# Patient Record
Sex: Female | Born: 1955 | Race: White | Hispanic: No
Health system: Southern US, Community
[De-identification: ages and names within clinical notes are randomized; demographics above are authoritative.]

## PROBLEM LIST (undated history)

## (undated) DIAGNOSIS — F419 Anxiety disorder, unspecified: Secondary | ICD-10-CM

## (undated) DIAGNOSIS — F32A Depression, unspecified: Secondary | ICD-10-CM

## (undated) DIAGNOSIS — I10 Essential (primary) hypertension: Secondary | ICD-10-CM

## (undated) DIAGNOSIS — T7840XA Allergy, unspecified, initial encounter: Secondary | ICD-10-CM

## (undated) DIAGNOSIS — M1990 Unspecified osteoarthritis, unspecified site: Secondary | ICD-10-CM

## (undated) DIAGNOSIS — R7303 Prediabetes: Secondary | ICD-10-CM

## (undated) DIAGNOSIS — N63 Unspecified lump in unspecified breast: Secondary | ICD-10-CM

## (undated) HISTORY — PX: COLONOSCOPY: SHX174

## (undated) HISTORY — DX: Allergy, unspecified, initial encounter: T78.40XA

## (undated) HISTORY — DX: Essential (primary) hypertension: I10

## (undated) HISTORY — PX: OTHER SURGICAL HISTORY: SHX169

## (undated) HISTORY — DX: Unspecified osteoarthritis, unspecified site: M19.90

---

## 2006-05-01 ENCOUNTER — Emergency Department: Payer: Self-pay | Admitting: Emergency Medicine

## 2008-01-24 DIAGNOSIS — G4700 Insomnia, unspecified: Secondary | ICD-10-CM

## 2008-01-24 LAB — HM DEXA SCAN: HM DEXA SCAN: NORMAL

## 2008-02-08 ENCOUNTER — Ambulatory Visit: Payer: Self-pay | Admitting: Family Medicine

## 2008-07-26 ENCOUNTER — Ambulatory Visit: Payer: Self-pay | Admitting: Family Medicine

## 2008-07-26 DIAGNOSIS — M542 Cervicalgia: Secondary | ICD-10-CM

## 2009-03-25 LAB — HM COLONOSCOPY: HM COLON: NORMAL

## 2009-04-01 ENCOUNTER — Ambulatory Visit: Payer: Self-pay | Admitting: Gastroenterology

## 2009-04-03 ENCOUNTER — Ambulatory Visit: Payer: Self-pay | Admitting: Family Medicine

## 2011-08-11 ENCOUNTER — Ambulatory Visit: Payer: Self-pay | Admitting: Family Medicine

## 2012-08-25 LAB — LIPID PANEL
Cholesterol: 234 mg/dL — AB (ref 0–200)
HDL: 87 mg/dL — AB (ref 35–70)
LDL Cholesterol: 130 mg/dL
TRIGLYCERIDES: 86 mg/dL (ref 40–160)

## 2012-08-25 LAB — HEMOGLOBIN A1C: Hgb A1c MFr Bld: 5.6 % (ref 4.0–6.0)

## 2012-08-30 ENCOUNTER — Ambulatory Visit: Payer: Self-pay | Admitting: Family Medicine

## 2012-12-13 LAB — HM PAP SMEAR: HM Pap smear: NORMAL

## 2014-01-15 ENCOUNTER — Ambulatory Visit: Payer: Self-pay | Admitting: Family Medicine

## 2014-01-15 LAB — HM MAMMOGRAPHY: HM Mammogram: NORMAL

## 2015-02-07 ENCOUNTER — Other Ambulatory Visit: Payer: Self-pay | Admitting: Family Medicine

## 2015-02-07 DIAGNOSIS — Z139 Encounter for screening, unspecified: Secondary | ICD-10-CM

## 2015-02-25 ENCOUNTER — Other Ambulatory Visit: Payer: Self-pay | Admitting: Family Medicine

## 2015-02-25 DIAGNOSIS — E2839 Other primary ovarian failure: Secondary | ICD-10-CM

## 2015-02-26 ENCOUNTER — Ambulatory Visit
Admission: RE | Admit: 2015-02-26 | Discharge: 2015-02-26 | Disposition: A | Discharge status: Home / Self Care | Payer: BLUE CROSS/BLUE SHIELD | Source: Ambulatory Visit | Attending: Family Medicine | Admitting: Family Medicine

## 2015-02-26 DIAGNOSIS — E2839 Other primary ovarian failure: Secondary | ICD-10-CM | POA: Diagnosis not present

## 2015-02-26 DIAGNOSIS — Z139 Encounter for screening, unspecified: Secondary | ICD-10-CM

## 2015-03-26 ENCOUNTER — Telehealth: Payer: Self-pay

## 2015-03-26 NOTE — Telephone Encounter (Signed)
-----   Message from Alba CoryKrichna Sowles, MD sent at 03/26/2015  8:53 AM EDT ----- Bone density was normal, continue calcium plus vitamin D supplementation  Please notify patient

## 2015-04-03 ENCOUNTER — Other Ambulatory Visit: Payer: Self-pay | Admitting: Family Medicine

## 2015-04-11 ENCOUNTER — Ambulatory Visit: Payer: Self-pay | Admitting: Family Medicine

## 2015-04-25 ENCOUNTER — Other Ambulatory Visit: Payer: Self-pay | Admitting: Family Medicine

## 2015-05-03 ENCOUNTER — Encounter: Payer: Self-pay | Admitting: Family Medicine

## 2015-05-03 DIAGNOSIS — M1990 Unspecified osteoarthritis, unspecified site: Secondary | ICD-10-CM | POA: Insufficient documentation

## 2015-05-03 DIAGNOSIS — I7300 Raynaud's syndrome without gangrene: Secondary | ICD-10-CM | POA: Insufficient documentation

## 2015-05-03 DIAGNOSIS — R7303 Prediabetes: Secondary | ICD-10-CM | POA: Insufficient documentation

## 2015-05-03 DIAGNOSIS — B009 Herpesviral infection, unspecified: Secondary | ICD-10-CM | POA: Insufficient documentation

## 2015-05-03 DIAGNOSIS — E559 Vitamin D deficiency, unspecified: Secondary | ICD-10-CM | POA: Insufficient documentation

## 2015-05-03 DIAGNOSIS — N951 Menopausal and female climacteric states: Secondary | ICD-10-CM | POA: Insufficient documentation

## 2015-05-03 DIAGNOSIS — F320 Major depressive disorder, single episode, mild: Secondary | ICD-10-CM | POA: Insufficient documentation

## 2015-05-06 ENCOUNTER — Encounter: Payer: Self-pay | Admitting: Family Medicine

## 2015-05-06 ENCOUNTER — Ambulatory Visit (INDEPENDENT_AMBULATORY_CARE_PROVIDER_SITE_OTHER): Payer: BLUE CROSS/BLUE SHIELD | Admitting: Family Medicine

## 2015-05-06 VITALS — BP 108/62 | HR 81 | Temp 98.3°F | Resp 16 | Ht 66.0 in | Wt 161.2 lb

## 2015-05-06 DIAGNOSIS — Z79899 Other long term (current) drug therapy: Secondary | ICD-10-CM | POA: Diagnosis not present

## 2015-05-06 DIAGNOSIS — E559 Vitamin D deficiency, unspecified: Secondary | ICD-10-CM | POA: Diagnosis not present

## 2015-05-06 DIAGNOSIS — F320 Major depressive disorder, single episode, mild: Secondary | ICD-10-CM

## 2015-05-06 DIAGNOSIS — R7309 Other abnormal glucose: Secondary | ICD-10-CM

## 2015-05-06 DIAGNOSIS — Z13220 Encounter for screening for lipoid disorders: Secondary | ICD-10-CM | POA: Diagnosis not present

## 2015-05-06 DIAGNOSIS — R5383 Other fatigue: Secondary | ICD-10-CM | POA: Diagnosis not present

## 2015-05-06 DIAGNOSIS — M542 Cervicalgia: Secondary | ICD-10-CM | POA: Diagnosis not present

## 2015-05-06 DIAGNOSIS — G4700 Insomnia, unspecified: Secondary | ICD-10-CM

## 2015-05-06 DIAGNOSIS — I7300 Raynaud's syndrome without gangrene: Secondary | ICD-10-CM

## 2015-05-06 DIAGNOSIS — R7303 Prediabetes: Secondary | ICD-10-CM

## 2015-05-06 MED ORDER — MELOXICAM 15 MG PO TABS: 15.0000 mg | ORAL_TABLET | Freq: Every day | ORAL | Status: DC

## 2015-05-06 MED ORDER — TRAZODONE HCL 50 MG PO TABS: 50.0000 mg | ORAL_TABLET | Freq: Every day | ORAL | Status: DC

## 2015-05-06 MED ORDER — ALPRAZOLAM 0.5 MG PO TABS: 0.5000 mg | ORAL_TABLET | Freq: Two times a day (BID) | ORAL | Status: DC | PRN

## 2015-05-06 MED ORDER — LISINOPRIL 10 MG PO TABS: 10.0000 mg | ORAL_TABLET | Freq: Every day | ORAL | Status: DC

## 2015-05-06 NOTE — Progress Notes (Signed)
Name: Denise Rasmussen   MRN: [id number]    DOB: 03/27/1956   Date:05/06/2015       Progress Note  Subjective  Chief Complaint  Chief Complaint  Patient presents with  . Depression    Patient states only taking 1/2 a wellbutrin in am and pm instead of 1, she states it makes her mean  . Insomnia    avg sleep per night 7-8hrs  . Neck Pain    HPI  Depression Major Depressive: still under a lot of stress. Her son got married in 2023/01/24, and her mother-in-law died around the same time. Her husband has metastases in his brain. She is taking medications, but advised to discuss with psychiatrist about taking half pill of wellbutrin SR , explained that SR are usuallyn not allowed to cut in half.   Insomnia: She is taking medications, and helps her fall and stay asleep, denies side effects of medication  Neck Pain: she takes medication prn only, but she had daily aching sensation, states sometimes she has tingling on arms, but no weakness.   Fatigue: she is always very tired, worried about being sick and unable to help her husband  Raynaud's : doing well with heat and ace inhibitor  Patient Active Problem List   Diagnosis Date Noted  . Herpes simplex type 1 infection 05/03/2015  . Depression, major, single episode, mild 05/03/2015  . Arthritis, degenerative 05/03/2015  . Post menopausal syndrome 05/03/2015  . Borderline diabetes 05/03/2015  . Raynaud's syndrome 05/03/2015  . Vitamin D deficiency 05/03/2015  . Cervical pain 07/26/2008  . Insomnia 01/24/2008    History reviewed. No pertinent past surgical history.  Family History  Problem Relation Age of Onset  . Breast cancer Maternal Grandmother     History   Social History  . Marital Status: Married    Spouse Name: N/A  . Number of Children: N/A  . Years of Education: N/A   Occupational History  . Not on file.   Social History Main Topics  . Smoking status: Never Smoker   . Smokeless tobacco: Not on file  . Alcohol Use:  0.0 oz/week    0 Standard drinks or equivalent per week     Comment: occasionally  . Drug Use: No  . Sexual Activity: Yes   Other Topics Concern  . Not on file   Social History Narrative     Current outpatient prescriptions:  .  ALPRAZolam (XANAX) 0.5 MG tablet, TAKE 1 TABLET BY MOUTH EVERY DAY AS NEEDED FOR ANXIETY, Disp: 30 tablet, Rfl: 0 .  baclofen (LIORESAL) 20 MG tablet, Take 1 tablet by mouth as needed., Disp: , Rfl: 0 .  buPROPion (WELLBUTRIN SR) 100 MG 12 hr tablet, Take 1 tablet by mouth 2 (two) times daily., Disp: , Rfl: 2 .  Cholecalciferol (VITAMIN D) 2000 UNITS CAPS, Take by mouth., Disp: , Rfl:  .  DULoxetine (CYMBALTA) 30 MG capsule, Take 1 capsule by mouth daily., Disp: , Rfl: 0 .  Glucosamine-Chondroitin (GLUCOSAMINE CHONDROITIN COMPLX) 500-250 MG CAPS, Take by mouth., Disp: , Rfl:  .  lisinopril (PRINIVIL,ZESTRIL) 10 MG tablet, TAKE 1 TABLET BY MOUTH DAILY FOR RAYNAUD'S AND ALSO BLOOD PRESSURE, Disp: 30 tablet, Rfl: 0 .  meloxicam (MOBIC) 15 MG tablet, TAKE 1 TABLET BY MOUTH EVERY DAY FOR INFLAMMATION, Disp: 30 tablet, Rfl: 0 .  OMEGA-3 FATTY ACIDS-VITAMIN E PO, Take by mouth., Disp: , Rfl:  .  traZODone (DESYREL) 50 MG tablet, Take 1 tablet by mouth daily.,  Disp: , Rfl: 0 .  valACYclovir (VALTREX) 500 MG tablet, Take 1 tablet by mouth as needed., Disp: , Rfl:   Not on File   ROS  Constitutional: Negative for fever or weight change.  Respiratory: Negative for cough and shortness of breath.   Cardiovascular: Negative for chest pain or palpitations.  Gastrointestinal: Negative for abdominal pain, no bowel changes.  Musculoskeletal: Negative for gait problem or joint swelling.  Skin: Negative for rash.  Neurological: Negative for dizziness or headache.  No other specific complaints in a complete review of systems (except as listed in HPI above). Objective  Filed Vitals:   05/06/15 1623  BP: 108/62  Pulse: 81  Temp: 98.3 F (36.8 C)  TempSrc: Oral  Resp:  16  Height: _0  (1.676 m)  Weight: 161 lb 3.2 oz (73.12 kg)  SpO2: 97%    Body mass index is 26.03 kg/(m^2).  Physical Exam  Constitutional: Patient appears well-developed and well-nourished. No distress.  Eyes:  No scleral icterus. PERL Neck: Normal range of motion. Neck supple. No tenderness to palpation  Cardiovascular: Normal rate, regular rhythm and normal heart sounds.  No murmur heard. No BLE edema. Pulmonary/Chest: Effort normal and breath sounds normal. No respiratory distress. Abdominal: Soft.  There is no tenderness. Psychiatric: Patient has a normal mood and affect. behavior is normal. Judgment and thought content normal. Frustrated about her current situation - caregiver for her husband   PHQ2/9: Depression screen PHQ 2/9 05/06/2015  Decreased Interest 2  Down, Depressed, Hopeless 2  PHQ - 2 Score 4  Altered sleeping 0  Tired, decreased energy 1  Change in appetite 0  Feeling bad or failure about yourself  0  Trouble concentrating 1  Moving slowly or fidgety/restless 0  Suicidal thoughts 0  PHQ-9 Score 6  Difficult doing work/chores Somewhat difficult   Seeing Psychiatrist  Fall Risk: Fall Risk  05/06/2015  Falls in the past year? No    Assessment & Plan   1. Insomnia  Stable on medication  - traZODone (DESYREL) 50 MG tablet; Take 1 tablet (50 mg total) by mouth daily.  Dispense: 90 tablet; Refill: 1  2. Cervical pain  She is very concerned, advised PT or massage therapy, she wants to continue medication prn and check X-ray, consider referral to Dr. Phyllis Ginger - [facility] Cervical Spine Complete; Future - Sed Rate (ESR) - meloxicam (MOBIC) 15 MG tablet; Take 1 tablet (15 mg total) by mouth daily.  Dispense: 90 tablet; Refill: 1  3. Borderline diabetes  Check hgbA1C   4. Raynaud's syndrome  - lisinopril (PRINIVIL,ZESTRIL) 10 MG tablet; Take 1 tablet (10 mg total) by mouth daily.  Dispense: 90 tablet; Refill: 1  5. Vitamin D deficiency  Check  level  6. Long-term use of high-risk medication  - CBC with Differential - Comprehensive Metabolic Panel (CMET)  7. Other fatigue  - CBC with Differential - Comprehensive Metabolic Panel (CMET) - TSH - Vitamin D (25 hydroxy) - Vitamin B12 - Sed Rate (ESR)  8. Lipid screening  - Lipid Profile  9. Depression, major, single episode, mild  - ALPRAZolam (XANAX) 0.5 MG tablet; Take 1 tablet (0.5 mg total) by mouth 2 (two) times daily as needed for anxiety.  Dispense: 30 tablet; Refill: 0

## 2015-05-09 ENCOUNTER — Other Ambulatory Visit: Payer: Self-pay | Admitting: Family Medicine

## 2015-05-10 LAB — CBC WITH DIFFERENTIAL/PLATELET
BASOS ABS: 0 10*3/uL (ref 0.0–0.2)
Basos: 1 %
EOS (ABSOLUTE): 0.1 10*3/uL (ref 0.0–0.4)
Eos: 3 %
Hematocrit: 40.3 % (ref 34.0–46.6)
Hemoglobin: 13.6 g/dL (ref 11.1–15.9)
IMMATURE GRANS (ABS): 0 10*3/uL (ref 0.0–0.1)
Immature Granulocytes: 0 %
LYMPHS: 32 %
Lymphocytes Absolute: 1.3 10*3/uL (ref 0.7–3.1)
MCH: 30.6 pg (ref 26.6–33.0)
MCHC: 33.7 g/dL (ref 31.5–35.7)
MCV: 91 fL (ref 79–97)
MONOS ABS: 0.4 10*3/uL (ref 0.1–0.9)
Monocytes: 11 %
NEUTROS PCT: 53 %
Neutrophils Absolute: 2.3 10*3/uL (ref 1.4–7.0)
Platelets: 227 10*3/uL (ref 150–379)
RBC: 4.44 x10E6/uL (ref 3.77–5.28)
RDW: 13.4 % (ref 12.3–15.4)
WBC: 4.2 10*3/uL (ref 3.4–10.8)

## 2015-05-10 LAB — COMPREHENSIVE METABOLIC PANEL
A/G RATIO: 2 (ref 1.1–2.5)
ALBUMIN: 4.7 g/dL (ref 3.5–5.5)
ALK PHOS: 89 IU/L (ref 39–117)
ALT: 11 IU/L (ref 0–32)
AST: 12 IU/L (ref 0–40)
BILIRUBIN TOTAL: 0.3 mg/dL (ref 0.0–1.2)
BUN / CREAT RATIO: 27 — AB (ref 9–23)
BUN: 25 mg/dL — ABNORMAL HIGH (ref 6–24)
CO2: 27 mmol/L (ref 18–29)
Calcium: 9.6 mg/dL (ref 8.7–10.2)
Chloride: 100 mmol/L (ref 97–108)
Creatinine, Ser: 0.91 mg/dL (ref 0.57–1.00)
GFR calc Af Amer: 82 mL/min/{1.73_m2} (ref >59)
GFR calc non Af Amer: 71 mL/min/{1.73_m2} (ref >59)
Globulin, Total: 2.3 g/dL (ref 1.5–4.5)
Glucose: 95 mg/dL (ref 65–99)
POTASSIUM: 4.4 mmol/L (ref 3.5–5.2)
SODIUM: 142 mmol/L (ref 134–144)
Total Protein: 7 g/dL (ref 6.0–8.5)

## 2015-05-10 LAB — LIPID PANEL
Chol/HDL Ratio: 2.7 ratio units (ref 0.0–4.4)
Cholesterol, Total: 225 mg/dL — ABNORMAL HIGH (ref 100–199)
HDL: 83 mg/dL (ref >39)
LDL Calculated: 127 mg/dL — ABNORMAL HIGH (ref 0–99)
Triglycerides: 74 mg/dL (ref 0–149)
VLDL Cholesterol Cal: 15 mg/dL (ref 5–40)

## 2015-05-10 LAB — TSH: TSH: 2.1 u[IU]/mL (ref 0.450–4.500)

## 2015-05-10 LAB — HEMOGLOBIN A1C
ESTIMATED AVERAGE GLUCOSE: 120 mg/dL
HEMOGLOBIN A1C: 5.8 % — AB (ref 4.8–5.6)

## 2015-05-10 LAB — VITAMIN B12: Vitamin B-12: 414 pg/mL (ref 211–946)

## 2015-05-10 LAB — SEDIMENTATION RATE: SED RATE: 2 mm/h (ref 0–40)

## 2015-05-10 LAB — VITAMIN D 25 HYDROXY (VIT D DEFICIENCY, FRACTURES): Vit D, 25-Hydroxy: 41.7 ng/mL (ref 30.0–100.0)

## 2015-05-12 ENCOUNTER — Telehealth: Payer: Self-pay

## 2015-05-12 NOTE — Telephone Encounter (Signed)
-----   Message from Alba CoryKrichna Sowles, MD sent at 05/11/2015  2:14 PM EDT ----- Labs are looking good- all within normal limits Based on the results of lipid panel her cardiovascular risk factor in the next 10 years is :1.6% and she does not need to start statin therapy

## 2015-05-12 NOTE — Telephone Encounter (Signed)
Lft VM all labs normal

## 2015-07-29 ENCOUNTER — Other Ambulatory Visit: Payer: Self-pay | Admitting: Family Medicine

## 2016-01-02 ENCOUNTER — Other Ambulatory Visit: Payer: Self-pay | Admitting: Family Medicine

## 2016-01-02 NOTE — Telephone Encounter (Signed)
Patient requesting refill. 

## 2016-01-13 ENCOUNTER — Encounter: Payer: Self-pay | Admitting: Family Medicine

## 2016-01-13 ENCOUNTER — Ambulatory Visit (INDEPENDENT_AMBULATORY_CARE_PROVIDER_SITE_OTHER): Payer: BLUE CROSS/BLUE SHIELD | Admitting: Family Medicine

## 2016-01-13 VITALS — BP 108/60 | HR 90 | Temp 98.6°F | Resp 14 | Ht 66.0 in | Wt 166.2 lb

## 2016-01-13 DIAGNOSIS — I7300 Raynaud's syndrome without gangrene: Secondary | ICD-10-CM

## 2016-01-13 DIAGNOSIS — F320 Major depressive disorder, single episode, mild: Secondary | ICD-10-CM

## 2016-01-13 DIAGNOSIS — L299 Pruritus, unspecified: Secondary | ICD-10-CM | POA: Diagnosis not present

## 2016-01-13 DIAGNOSIS — L989 Disorder of the skin and subcutaneous tissue, unspecified: Secondary | ICD-10-CM | POA: Diagnosis not present

## 2016-01-13 DIAGNOSIS — G4700 Insomnia, unspecified: Secondary | ICD-10-CM | POA: Diagnosis not present

## 2016-01-13 DIAGNOSIS — R7303 Prediabetes: Secondary | ICD-10-CM | POA: Diagnosis not present

## 2016-01-13 DIAGNOSIS — B009 Herpesviral infection, unspecified: Secondary | ICD-10-CM | POA: Diagnosis not present

## 2016-01-13 DIAGNOSIS — M542 Cervicalgia: Secondary | ICD-10-CM | POA: Diagnosis not present

## 2016-01-13 DIAGNOSIS — F4321 Adjustment disorder with depressed mood: Secondary | ICD-10-CM

## 2016-01-13 DIAGNOSIS — E559 Vitamin D deficiency, unspecified: Secondary | ICD-10-CM | POA: Diagnosis not present

## 2016-01-13 MED ORDER — ALPRAZOLAM 0.5 MG PO TABS: 0.5000 mg | ORAL_TABLET | Freq: Two times a day (BID) | ORAL | Status: DC | PRN

## 2016-01-13 MED ORDER — LISINOPRIL 10 MG PO TABS: 10.0000 mg | ORAL_TABLET | Freq: Every day | ORAL | Status: DC

## 2016-01-13 NOTE — Progress Notes (Signed)
Name: Denise Rasmussen   MRN: [id number]    DOB: 09-06-1956   Date:01/13/2016       Progress Note  Subjective  Chief Complaint  Chief Complaint  Patient presents with  . Depression  . Insomnia  . Medication Refill    Xanax, Lisinopril & meloxicam  . Hand Pain    patient has a wart on her left finger that she wants to have it removed.  . Rash    patient has an itchy area on her back that has been there for some time now. patient has tried otc lotions and ointments    HPI  Lesion on left index finger: going on for the past 6 months, using topical wart medication but it keeps coming back and is painful to touch, no oozing or redness  Pruritus: on her back, on right mid back, no skin lesions.  HSVII: recurrent fever blister, she used to have recurrent rash on buttocks , married for 35 years, no medication for prevention is being used at this time.   Raynauds: doing well on lisinopril, she avoid sandals, wears soaks, using a heater at work, and is controlled at this time  Major Depression: still under a lot of stress, mother died in 08/05/23 also having problems with the way the will was made. She also struggles with husbands medical problems, diagnosed with cancer in 2003 with prostate cancer, had prostatectomy and in 2006 had a lump on his lung, followed by thyroid metastases - had thyroidectomy, after that had it spread to his pleura, last year treated for brain metastasis now he has a lesion on his kidney. She is taking medications, sees Dr. Maryruth Bun and also a counselor but not feeling much better.   Insomnia: stable on medication, no side effects   Patient Active Problem List   Diagnosis Date Noted  . Herpes simplex type 2 infection 05/03/2015  . Depression, major, single episode, mild (HCC) 05/03/2015  . Arthritis, degenerative 05/03/2015  . Post menopausal syndrome 05/03/2015  . Borderline diabetes 05/03/2015  . Raynaud's syndrome 05/03/2015  . Vitamin D deficiency 05/03/2015  .  Cervical pain 07/26/2008  . Insomnia 01/24/2008    History reviewed. No pertinent past surgical history.  Family History  Problem Relation Age of Onset  . Breast cancer Maternal Grandmother     Social History   Social History  . Marital Status: Married    Spouse Name: N/A  . Number of Children: N/A  . Years of Education: N/A   Occupational History  . Not on file.   Social History Main Topics  . Smoking status: Never Smoker   . Smokeless tobacco: Not on file  . Alcohol Use: 0.0 oz/week    0 Standard drinks or equivalent per week     Comment: occasionally  . Drug Use: No  . Sexual Activity: Yes   Other Topics Concern  . Not on file   Social History Narrative     Current outpatient prescriptions:  .  ALPRAZolam (XANAX) 0.5 MG tablet, Take 1 tablet (0.5 mg total) by mouth 2 (two) times daily as needed for anxiety., Disp: 30 tablet, Rfl: 0 .  buPROPion (WELLBUTRIN SR) 100 MG 12 hr tablet, Take 1 tablet by mouth 2 (two) times daily., Disp: , Rfl: 2 .  Cholecalciferol (VITAMIN D) 2000 UNITS CAPS, Take by mouth., Disp: , Rfl:  .  DULoxetine (CYMBALTA) 30 MG capsule, Take 3 capsules by mouth daily., Disp: , Rfl: 0 .  Glucosamine-Chondroitin (GLUCOSAMINE CHONDROITIN  COMPLX) 500-250 MG CAPS, Take by mouth., Disp: , Rfl:  .  lisinopril (PRINIVIL,ZESTRIL) 10 MG tablet, Take 1 tablet (10 mg total) by mouth daily., Disp: 90 tablet, Rfl: 1 .  meloxicam (MOBIC) 15 MG tablet, TAKE 1 TABLET(15 MG) BY MOUTH DAILY, Disp: 90 tablet, Rfl: 0 .  OMEGA-3 FATTY ACIDS-VITAMIN E PO, Take 1,000 mg by mouth. , Disp: , Rfl:  .  traZODone (DESYREL) 50 MG tablet, Take 1 tablet (50 mg total) by mouth daily., Disp: 90 tablet, Rfl: 1 .  baclofen (LIORESAL) 20 MG tablet, Take 1 tablet by mouth as needed. Reported on 01/13/2016, Disp: , Rfl: 0 .  valACYclovir (VALTREX) 500 MG tablet, TAKE 1 TABLET BY MOUTH THREE TIMES DAILY FOR 7 DAYS AS NEEDED FOR OUT BREAKS OR 2 TABLETS TWICE DAILY FOR FEVER BLISTERS  (Patient not taking: Reported on 01/13/2016), Disp: 30 tablet, Rfl: 2  No Known Allergies   ROS  Ten systems reviewed and is negative except as mentioned in HPI   Objective  Filed Vitals:   01/13/16 1519  BP: 108/60  Pulse: 90  Temp: 98.6 F (37 C)  TempSrc: Oral  Resp: 14  Height:  (1.676 m)  Weight: 166 lb 3.2 oz (75.388 kg)  SpO2: 96%    Body mass index is 26.84 kg/(m^2).  Physical Exam  Constitutional: Patient appears well-developed and well-nourished No distress.  HEENT: head atraumatic, normocephalic, pupils equal and reactive to light,  neck supple, throat within normal limits Cardiovascular: Normal rate, regular rhythm and normal heart sounds.  No murmur heard. No BLE edema. Pulmonary/Chest: Effort normal and breath sounds normal. No respiratory distress. Abdominal: Soft.  There is no tenderness. Psychiatric: Patient has a normal mood and affect. behavior is normal. Judgment and thought content normal. Skin; hyperpigmentation on her back, no rashes, also has a hard spot on left index finger that is tender and also on left middle finger causing deformity of the nail    PHQ2/9: Depression screen [facility] 2/9 01/13/2016 05/06/2015  Decreased Interest - 2  Down, Depressed, Hopeless 3 2  PHQ - 2 Score 3 4  Altered sleeping 2 0  Tired, decreased energy 2 1  Change in appetite 0 0  Feeling bad or failure about yourself  0 0  Trouble concentrating 1 1  Moving slowly or fidgety/restless 0 0  Suicidal thoughts 0 0  PHQ-9 Score 8 6  Difficult doing work/chores Somewhat difficult Somewhat difficult     Fall Risk: Fall Risk  01/13/2016 05/06/2015  Falls in the past year? No No     Functional Status Survey: Is the patient deaf or have difficulty hearing?: No Does the patient have difficulty seeing, even when wearing glasses/contacts?: No Does the patient have difficulty concentrating, remembering, or making decisions?: Yes Does the patient have difficulty walking  or climbing stairs?: No Does the patient have difficulty dressing or bathing?: No Does the patient have difficulty doing errands alone such as visiting a doctor's office or shopping?: No    Assessment & Plan  1. Insomnia  Continue medication   2. Borderline diabetes  Recheck labs yearly   3. Depression, major, single episode, mild (HCC)  Continue follow up with psychiatrist - ALPRAZolam (XANAX) 0.5 MG tablet; Take 1 tablet (0.5 mg total) by mouth 2 (two) times daily as needed for anxiety.  Dispense: 30 tablet; Refill: 0  4. Cervical pain  Stable, but advised to take half of Meloxicam or alternate with Tylenol   5. Vitamin D  deficiency  Continue vitamin D supplementation   6. Herpes simplex type 2 infection  Continue prn mediation, only has occasional fever blisters now, used to have recurrent rash on buttocks 7. Raynaud's disease without gangrene  - lisinopril (PRINIVIL,ZESTRIL) 10 MG tablet; Take 1 tablet (10 mg total) by mouth daily.  Dispense: 90 tablet; Refill: 1  8. Grieving  Mother died October 2016  9. Pruritus  - Ambulatory referral to Dermatology  10. Skin lesion of hand  - Ambulatory referral to Dermatology

## 2016-01-27 ENCOUNTER — Telehealth: Payer: Self-pay | Admitting: Family Medicine

## 2016-01-27 NOTE — Telephone Encounter (Signed)
On your desk to be faxed. Thank you

## 2016-01-27 NOTE — Telephone Encounter (Signed)
Orders was faxed and confirmation was received.

## 2016-01-27 NOTE — Telephone Encounter (Signed)
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 [facility] [phone]) [phone]) [phone] ATTN: Massie BougieBelinda Day

## 2016-01-28 ENCOUNTER — Other Ambulatory Visit: Payer: Self-pay | Admitting: Family Medicine

## 2016-01-28 DIAGNOSIS — Z1231 Encounter for screening mammogram for malignant neoplasm of breast: Secondary | ICD-10-CM

## 2016-01-29 ENCOUNTER — Ambulatory Visit
Admission: RE | Admit: 2016-01-29 | Discharge: 2016-01-29 | Disposition: A | Discharge status: Home / Self Care | Payer: BLUE CROSS/BLUE SHIELD | Source: Ambulatory Visit | Attending: Family Medicine | Admitting: Family Medicine

## 2016-01-29 DIAGNOSIS — Z1231 Encounter for screening mammogram for malignant neoplasm of breast: Secondary | ICD-10-CM | POA: Insufficient documentation

## 2016-02-03 ENCOUNTER — Other Ambulatory Visit: Payer: Self-pay | Admitting: Family Medicine

## 2016-02-03 NOTE — Telephone Encounter (Signed)
Patient requesting refill. 

## 2016-02-16 ENCOUNTER — Other Ambulatory Visit: Payer: Self-pay | Admitting: Family Medicine

## 2016-02-16 NOTE — Telephone Encounter (Signed)
Patient requesting refill. 

## 2016-03-31 ENCOUNTER — Other Ambulatory Visit: Payer: Self-pay | Admitting: Family Medicine

## 2016-04-01 NOTE — Telephone Encounter (Signed)
Patient requesting refill. 

## 2016-04-28 ENCOUNTER — Other Ambulatory Visit: Payer: Self-pay | Admitting: Family Medicine

## 2016-05-25 ENCOUNTER — Other Ambulatory Visit: Payer: Self-pay | Admitting: Family Medicine

## 2016-05-25 DIAGNOSIS — I7300 Raynaud's syndrome without gangrene: Secondary | ICD-10-CM

## 2016-05-26 NOTE — Telephone Encounter (Signed)
Patient requesting refill. Lisinopril

## 2016-06-30 DIAGNOSIS — M79643 Pain in unspecified hand: Secondary | ICD-10-CM | POA: Insufficient documentation

## 2016-07-01 ENCOUNTER — Encounter: Payer: Self-pay | Admitting: Family Medicine

## 2016-07-01 DIAGNOSIS — M6740 Ganglion, unspecified site: Secondary | ICD-10-CM | POA: Insufficient documentation

## 2016-07-13 ENCOUNTER — Ambulatory Visit (INDEPENDENT_AMBULATORY_CARE_PROVIDER_SITE_OTHER): Payer: BLUE CROSS/BLUE SHIELD | Admitting: Family Medicine

## 2016-07-13 ENCOUNTER — Encounter: Payer: Self-pay | Admitting: Family Medicine

## 2016-07-13 VITALS — BP 107/63 | HR 92 | Temp 98.3°F | Resp 16 | Ht 66.0 in | Wt 164.3 lb

## 2016-07-13 DIAGNOSIS — B009 Herpesviral infection, unspecified: Secondary | ICD-10-CM | POA: Diagnosis not present

## 2016-07-13 DIAGNOSIS — Z9889 Other specified postprocedural states: Secondary | ICD-10-CM

## 2016-07-13 DIAGNOSIS — R7303 Prediabetes: Secondary | ICD-10-CM | POA: Diagnosis not present

## 2016-07-13 DIAGNOSIS — Z79899 Other long term (current) drug therapy: Secondary | ICD-10-CM

## 2016-07-13 DIAGNOSIS — I7300 Raynaud's syndrome without gangrene: Secondary | ICD-10-CM

## 2016-07-13 DIAGNOSIS — Z13220 Encounter for screening for lipoid disorders: Secondary | ICD-10-CM

## 2016-07-13 DIAGNOSIS — E559 Vitamin D deficiency, unspecified: Secondary | ICD-10-CM | POA: Diagnosis not present

## 2016-07-13 DIAGNOSIS — F320 Major depressive disorder, single episode, mild: Secondary | ICD-10-CM | POA: Diagnosis not present

## 2016-07-13 DIAGNOSIS — M542 Cervicalgia: Secondary | ICD-10-CM

## 2016-07-13 MED ORDER — LISINOPRIL 10 MG PO TABS: ORAL_TABLET | ORAL | 1 refills | Status: DC

## 2016-07-13 MED ORDER — MELOXICAM 15 MG PO TABS: ORAL_TABLET | ORAL | 1 refills | Status: DC

## 2016-07-13 NOTE — Progress Notes (Signed)
Name: Denise Rasmussen   MRN: [id number]    DOB: 1956/01/30   Date:07/13/2016       Progress Note  Subjective  Chief Complaint  Chief Complaint  Patient presents with  . Follow-up    6 mo    HPI  History of hand surgery :  Patient went to hand surgeon because the lesion on right hand was causing pain when shaking hands, healing now, had 4 stiches.   HSVII: recurrent fever blister, she used to have recurrent rash on buttocks , married for 35 years, no medication for prevention is being used at this time. No recent outbreaks, last episode about 4 months ago  Raynauds: doing well on lisinopril, she avoid sandals, wears soaks, using a heater at work, and is controlled at this time  Major Depression: still under a lot of stress, mother died in 08/23/2015 also having problems with the way the will was made. She also struggles with husbands medical problems, diagnosed with cancer in 2003 with prostate cancer, had prostatectomy and in 2006 had a lump on his lung, followed by thyroid metastases - had thyroidectomy, after that had it spread to his pleura, last year treated for brain metastasis now he has a lesion on his kidney - he is feeling better now. She is taking medications, sees Dr. Maryruth Bun and also a counselor Audria Nine. She likes her counselor.   Insomnia: stable on medication, no side effects, she states able to skip doses  Pre-diabetes: due for labs. She denies polyphagia, polyuria or polydipsia   Patient Active Problem List   Diagnosis Date Noted  . Ganglion cyst 07/01/2016  . Herpes simplex type 2 infection 05/03/2015  . Depression, major, single episode, mild (HCC) 05/03/2015  . Arthritis, degenerative 05/03/2015  . Post menopausal syndrome 05/03/2015  . Borderline diabetes 05/03/2015  . Raynaud's syndrome 05/03/2015  . Vitamin D deficiency 05/03/2015  . Cervical pain 07/26/2008  . Insomnia 01/24/2008    History reviewed. No pertinent surgical history.  Family  History  Problem Relation Age of Onset  . Breast cancer Maternal Grandmother     Social History   Social History  . Marital status: Married    Spouse name: N/A  . Number of children: N/A  . Years of education: N/A   Occupational History  . Not on file.   Social History Main Topics  . Smoking status: Never Smoker  . Smokeless tobacco: Never Used  . Alcohol use 0.0 oz/week     Comment: occasionally  . Drug use: No  . Sexual activity: Yes   Other Topics Concern  . Not on file   Social History Narrative  . No narrative on file     Current Outpatient Prescriptions:  .  ALPRAZolam (XANAX) 0.5 MG tablet, TAKE 1 TABLET BY MOUTH EVERY DAY AS NEEDED FOR ANXIETY, Disp: 30 tablet, Rfl: 0 .  buPROPion (WELLBUTRIN SR) 150 MG 12 hr tablet, Take 1 tablet by mouth daily., Disp: , Rfl: 2 .  Cholecalciferol (VITAMIN D) 2000 UNITS CAPS, Take by mouth., Disp: , Rfl:  .  DULoxetine (CYMBALTA) 30 MG capsule, Take 3 capsules by mouth daily., Disp: , Rfl: 0 .  lisinopril (PRINIVIL,ZESTRIL) 10 MG tablet, TAKE 1 TABLET(10 MG) BY MOUTH DAILY, Disp: 90 tablet, Rfl: 0 .  meloxicam (MOBIC) 15 MG tablet, TAKE 1 TABLET BY MOUTH EVERY DAY FOR INFLAMMATION, Disp: 30 tablet, Rfl: 2 .  OMEGA-3 FATTY ACIDS-VITAMIN E PO, Take 1,000 mg by mouth. , Disp: ,  Rfl:  .  traZODone (DESYREL) 50 MG tablet, Take 1 tablet (50 mg total) by mouth daily. (Patient taking differently: Take 50-100 mg by mouth at bedtime. ), Disp: 90 tablet, Rfl: 1 .  valACYclovir (VALTREX) 500 MG tablet, TAKE 1 TABLET BY MOUTH THREE TIMES DAILY FOR 7 DAYS AS NEEDED FOR OUT BREAKS OR 2 TABLETS TWICE DAILY FOR FEVER BLISTERS, Disp: 30 tablet, Rfl: 2 .  baclofen (LIORESAL) 20 MG tablet, Take 1 tablet by mouth as needed. Reported on 01/13/2016, Disp: , Rfl: 0 .  Glucosamine-Chondroitin (GLUCOSAMINE CHONDROITIN COMPLX) 500-250 MG CAPS, Take by mouth., Disp: , Rfl:   No Known Allergies   ROS  Constitutional: Negative for fever or weight change.   Respiratory: Negative for cough and shortness of breath.   Cardiovascular: Negative for chest pain or palpitations.  Gastrointestinal: Negative for abdominal pain, no bowel changes.  Musculoskeletal: Negative for gait problem or joint swelling.  Skin: Negative for rash.  Neurological: Negative for dizziness or headache.  No other specific complaints in a complete review of systems (except as listed in HPI above).  Objective  Vitals:   07/13/16 0801  BP: 107/63  Pulse: 92  Resp: 16  Temp: 98.3 F (36.8 C)  TempSrc: Oral  SpO2: 96%  Weight: 164 lb 4.8 oz (74.5 kg)  Height: 5\' 6"  (1.676 m)    Body mass index is 26.52 kg/m.  Physical Exam  Constitutional: Patient appears well-developed and well-nourished.  No distress.  HEENT: head atraumatic, normocephalic, pupils equal and reactive to light, neck supple, throat within normal limits Cardiovascular: Normal rate, regular rhythm and normal heart sounds.  No murmur heard. No BLE edema. Pulmonary/Chest: Effort normal and breath sounds normal. No respiratory distress. Abdominal: Soft.  There is no tenderness. Psychiatric: Patient has a normal mood and affect. behavior is normal. Judgment and thought content normal.  PHQ2/9: Depression screen [facility] 2/9 07/13/2016 01/13/2016 05/06/2015  Decreased Interest 0 - 2  Down, Depressed, Hopeless 0 3 2  PHQ - 2 Score 0 3 4  Altered sleeping - 2 0  Tired, decreased energy - 2 1  Change in appetite - 0 0  Feeling bad or failure about yourself  - 0 0  Trouble concentrating - 1 1  Moving slowly or fidgety/restless - 0 0  Suicidal thoughts - 0 0  PHQ-9 Score - 8 6  Difficult doing work/chores - Somewhat difficult Somewhat difficult     Fall Risk: Fall Risk  07/13/2016 01/13/2016 05/06/2015  Falls in the past year? No No No     Functional Status Survey: Is the patient deaf or have difficulty hearing?: No Does the patient have difficulty seeing, even when wearing glasses/contacts?: Yes  (glasses) Does the patient have difficulty concentrating, remembering, or making decisions?: No Does the patient have difficulty walking or climbing stairs?: No Does the patient have difficulty dressing or bathing?: No Does the patient have difficulty doing errands alone such as visiting a doctor's office or shopping?: No    Assessment & Plan  1. Borderline diabetes  - Hemoglobin A1c  2. Vitamin D deficiency  - VITAMIN D 25 Hydroxy (Vit-D Deficiency, Fractures)  3. Lipid screening  - Lipid panel  4. Herpes simplex type 2 infection  Continue prn medication   5. Raynaud's disease without gangrene  Doing well  - lisinopril (PRINIVIL,ZESTRIL) 10 MG tablet; TAKE 1 TABLET(10 MG) BY MOUTH DAILY  Dispense: 90 tablet; Refill: 1  6. History of hand surgery  Doing well  7. Long-term use of high-risk medication  - COMPLETE METABOLIC PANEL WITH GFR - CBC with Differential/Platelet  8. Cervical pain  Discussed risk of kidney dysfunction and to try to decrease the dose, but she wants to try alternating with Tylenol every other day - meloxicam (MOBIC) 15 MG tablet; Daily  Dispense: 90 tablet; Refill: 1  9. Depression, major, single episode, mild (HCC)  Continue follow up with Dr. Maryruth BunKapur and Margreta JourneyKoncella as a counselor

## 2016-07-13 NOTE — Addendum Note (Signed)
Addended by: Cynda FamiliaJOHNSON, Kaelyn Nauta L on: 07/13/2016 11:46 AM   Modules accepted: Orders

## 2016-07-14 ENCOUNTER — Ambulatory Visit: Payer: BLUE CROSS/BLUE SHIELD | Admitting: Family Medicine

## 2016-07-25 ENCOUNTER — Other Ambulatory Visit: Payer: Self-pay | Admitting: Family Medicine

## 2016-09-27 ENCOUNTER — Other Ambulatory Visit: Payer: Self-pay | Admitting: Family Medicine

## 2016-09-27 LAB — COMPLETE METABOLIC PANEL WITH GFR
ALT: 14 U/L (ref 6–29)
AST: 17 U/L (ref 10–35)
Albumin: 4.4 g/dL (ref 3.6–5.1)
Alkaline Phosphatase: 59 U/L (ref 33–130)
BUN: 27 mg/dL — AB (ref 7–25)
CHLORIDE: 102 mmol/L (ref 98–110)
CO2: 30 mmol/L (ref 20–31)
CREATININE: 0.89 mg/dL (ref 0.50–1.05)
Calcium: 9.5 mg/dL (ref 8.6–10.4)
GFR, Est African American: 83 mL/min (ref >=60)
GFR, Est Non African American: 72 mL/min (ref >=60)
GLUCOSE: 86 mg/dL (ref 65–99)
POTASSIUM: 4.3 mmol/L (ref 3.5–5.3)
SODIUM: 138 mmol/L (ref 135–146)
Total Bilirubin: 0.7 mg/dL (ref 0.2–1.2)
Total Protein: 6.4 g/dL (ref 6.1–8.1)

## 2016-09-27 LAB — LIPID PANEL
Cholesterol: 222 mg/dL — ABNORMAL HIGH (ref <200)
HDL: 95 mg/dL (ref >50)
LDL CALC: 116 mg/dL — AB (ref <100)
TRIGLYCERIDES: 57 mg/dL (ref <150)
Total CHOL/HDL Ratio: 2.3 Ratio (ref <5.0)
VLDL: 11 mg/dL (ref <30)

## 2016-09-27 LAB — CBC WITH DIFFERENTIAL/PLATELET
Basophils Absolute: 37 cells/uL (ref 0–200)
Basophils Relative: 1 %
EOS ABS: 111 {cells}/uL (ref 15–500)
Eosinophils Relative: 3 %
HEMATOCRIT: 40.5 % (ref 35.0–45.0)
HEMOGLOBIN: 13.1 g/dL (ref 11.7–15.5)
LYMPHS ABS: 1147 {cells}/uL (ref 850–3900)
LYMPHS PCT: 31 %
MCH: 30.5 pg (ref 27.0–33.0)
MCHC: 32.3 g/dL (ref 32.0–36.0)
MCV: 94.2 fL (ref 80.0–100.0)
MONO ABS: 370 {cells}/uL (ref 200–950)
MPV: 9.7 fL (ref 7.5–12.5)
Monocytes Relative: 10 %
NEUTROS PCT: 55 %
Neutro Abs: 2035 cells/uL (ref 1500–7800)
Platelets: 229 10*3/uL (ref 140–400)
RBC: 4.3 MIL/uL (ref 3.80–5.10)
RDW: 13.7 % (ref 11.0–15.0)
WBC: 3.7 10*3/uL — AB (ref 3.8–10.8)

## 2016-09-27 LAB — HEMOGLOBIN A1C
HEMOGLOBIN A1C: 5 % (ref <5.7)
Mean Plasma Glucose: 97 mg/dL

## 2016-09-28 LAB — VITAMIN D 25 HYDROXY (VIT D DEFICIENCY, FRACTURES): VIT D 25 HYDROXY: 51 ng/mL (ref 30–100)

## 2016-09-29 ENCOUNTER — Encounter: Payer: Self-pay | Admitting: Family Medicine

## 2016-09-29 ENCOUNTER — Other Ambulatory Visit: Payer: Self-pay | Admitting: Family Medicine

## 2016-09-29 ENCOUNTER — Ambulatory Visit (INDEPENDENT_AMBULATORY_CARE_PROVIDER_SITE_OTHER): Payer: BLUE CROSS/BLUE SHIELD | Admitting: Family Medicine

## 2016-09-29 VITALS — BP 110/58 | HR 76 | Temp 97.7°F | Resp 16 | Ht 67.0 in | Wt 161.1 lb

## 2016-09-29 DIAGNOSIS — N6311 Unspecified lump in the right breast, upper outer quadrant: Secondary | ICD-10-CM | POA: Diagnosis not present

## 2016-09-29 DIAGNOSIS — N952 Postmenopausal atrophic vaginitis: Secondary | ICD-10-CM | POA: Diagnosis not present

## 2016-09-29 DIAGNOSIS — Z01419 Encounter for gynecological examination (general) (routine) without abnormal findings: Secondary | ICD-10-CM | POA: Diagnosis not present

## 2016-09-29 DIAGNOSIS — N814 Uterovaginal prolapse, unspecified: Secondary | ICD-10-CM

## 2016-09-29 DIAGNOSIS — Z124 Encounter for screening for malignant neoplasm of cervix: Secondary | ICD-10-CM | POA: Diagnosis not present

## 2016-09-29 DIAGNOSIS — N812 Incomplete uterovaginal prolapse: Secondary | ICD-10-CM

## 2016-09-29 MED ORDER — ESTROGENS, CONJUGATED 0.625 MG/GM VA CREA: 1.0000 | TOPICAL_CREAM | Freq: Every day | VAGINAL | 12 refills | Status: DC

## 2016-09-29 MED ORDER — ASPIRIN EC 81 MG PO TBEC: 81.0000 mg | DELAYED_RELEASE_TABLET | Freq: Every day | ORAL | 0 refills | Status: DC

## 2016-09-29 NOTE — Progress Notes (Signed)
Name: Denise Rasmussen   MRN: [id number]    DOB: 03-12-1956   Date:09/29/2016       Progress Note  Subjective  Chief Complaint  Chief Complaint  Patient presents with  . Annual Exam    HPI  Well Woman: she has changed her diet, exercise more and has lost some weight. We reviewed her labs and has shown improvement on hgbA1C and lipid panel. Discussed starting aspirin 81 mg daily.  Husband  still has cancer and new lesions found on his kidney. They are still sexually active, she has dyspareunia from atrophy and is wiling to try topical medication . She has noticed some urinary urgency lately, but not severe yet, discussed Keggle exercises  Patient Active Problem List   Diagnosis Date Noted  . Ganglion cyst 07/01/2016  . Herpes simplex type 2 infection 05/03/2015  . Depression, major, single episode, mild ([place]) 05/03/2015  . Arthritis, degenerative 05/03/2015  . Post menopausal syndrome 05/03/2015  . Borderline diabetes 05/03/2015  . Raynaud's syndrome 05/03/2015  . Vitamin D deficiency 05/03/2015  . Cervical pain 07/26/2008  . Insomnia 01/24/2008    History reviewed. No pertinent surgical history.  Family History  Problem Relation Age of Onset  . Breast cancer Maternal Grandmother     Social History   Social History  . Marital status: Married    Spouse name: N/A  . Number of children: N/A  . Years of education: N/A   Occupational History  . Not on file.   Social History Main Topics  . Smoking status: Never Smoker  . Smokeless tobacco: Never Used  . Alcohol use 0.0 oz/week     Comment: occasionally  . Drug use: No  . Sexual activity: Yes   Other Topics Concern  . Not on file   Social History Narrative  . No narrative on file     Current Outpatient Prescriptions:  .  ALPRAZolam (XANAX) 0.5 MG tablet, TAKE 1 TABLET BY MOUTH EVERY DAY AS NEEDED FOR ANXIETY, Disp: 30 tablet, Rfl: 0 .  aspirin EC 81 MG tablet, Take 1 tablet (81 mg total) by mouth daily., Disp: 30  tablet, Rfl: 0 .  baclofen (LIORESAL) 20 MG tablet, Take 1 tablet by mouth as needed. Reported on 01/13/2016, Disp: , Rfl: 0 .  buPROPion (WELLBUTRIN SR) 100 MG 12 hr tablet, , Disp: , Rfl: 0 .  Cholecalciferol (VITAMIN D) 2000 UNITS CAPS, Take by mouth., Disp: , Rfl:  .  conjugated estrogens (PREMARIN) vaginal cream, Place 1 Applicatorful vaginally daily. For the first 2 weeks after that twice a week, Disp: 42.5 g, Rfl: 12 .  DULoxetine (CYMBALTA) 30 MG capsule, Take 90 capsules by mouth daily. , Disp: , Rfl: 0 .  lisinopril (PRINIVIL,ZESTRIL) 10 MG tablet, TAKE 1 TABLET(10 MG) BY MOUTH DAILY, Disp: 90 tablet, Rfl: 1 .  meloxicam (MOBIC) 15 MG tablet, Daily, Disp: 90 tablet, Rfl: 1 .  OMEGA-3 FATTY ACIDS-VITAMIN E PO, Take 1,000 mg by mouth. , Disp: , Rfl:  .  traZODone (DESYREL) 50 MG tablet, Take 1 tablet (50 mg total) by mouth daily. (Patient taking differently: Take 50-100 mg by mouth at bedtime. ), Disp: 90 tablet, Rfl: 1 .  valACYclovir (VALTREX) 500 MG tablet, TAKE 1 TABLET BY MOUTH THREE TIMES DAILY FOR 7 DAYS AS NEEDED FOR OUT BREAKS OR 2 TABLETS TWICE DAILY FOR FEVER BLISTERS, Disp: 30 tablet, Rfl: 2  No Known Allergies   ROS  Constitutional: Negative for fever, positive for weight change.  Respiratory: Negative for cough and shortness of breath.   Cardiovascular: Negative for chest pain or palpitations.  Gastrointestinal: Negative for abdominal pain, no bowel changes.  Musculoskeletal: Negative for gait problem or joint swelling.  Skin: Negative for rash.  Neurological: Negative for dizziness or headache.  No other specific complaints in a complete review of systems (except as listed in HPI above).   Objective  Vitals:   09/29/16 1410  BP: (!) 110/58  Pulse: 76  Resp: 16  Temp: 97.7 F (36.5 C)  TempSrc: Oral  SpO2: 96%  Weight: 161 lb 2 oz (73.1 kg)  Height: '5\' 7"'  (1.702 m)    Body mass index is 25.24 kg/m.  Physical Exam  Constitutional: Patient appears  well-developed and well-nourished. No distress.  HENT: Head: Normocephalic and atraumatic. Ears: B TMs ok, no erythema or effusion; Nose: Nose normal. Mouth/Throat: Oropharynx is clear and moist. No oropharyngeal exudate.  Eyes: Conjunctivae and EOM are normal. Pupils are equal, round, and reactive to light. No scleral icterus.  Neck: Normal range of motion. Neck supple. No JVD present. No thyromegaly present.  Cardiovascular: Normal rate, regular rhythm and normal heart sounds.  No murmur heard. No BLE edema. Pulmonary/Chest: Effort normal and breath sounds normal. No respiratory distress. Abdominal: Soft. Bowel sounds are normal, no distension. There is no tenderness. no masses Breast: right breast has a long shaped lump , non-tender ,  no nipple discharge or rashes FEMALE GENITALIA:  External genitalia normal External urethra normal Vaginal walls shows atrophy without discharge or lesions Cervix normal without discharge or lesions, she has uterine prolapse, and some vaginal atrophy Bimanual exam normal without masses RECTAL: not done Musculoskeletal: Normal range of motion, no joint effusions. No gross deformities Neurological: he is alert and oriented to person, place, and time. No cranial nerve deficit. Coordination, balance, strength, speech and gait are normal.  Skin: Skin is warm and dry. No rash noted. No erythema.  Psychiatric: Patient has a normal mood and affect. behavior is normal. Judgment and thought content normal.  Recent Results (from the past 2160 hour(s))  COMPLETE METABOLIC PANEL WITH GFR     Status: Abnormal   Collection Time: 09/27/16  8:10 AM  Result Value Ref Range   Sodium 138 135 - 146 mmol/L   Potassium 4.3 3.5 - 5.3 mmol/L   Chloride 102 98 - 110 mmol/L   CO2 30 20 - 31 mmol/L   Glucose, Bld 86 65 - 99 mg/dL   BUN 27 (H) 7 - 25 mg/dL   Creat 0.89 0.50 - 1.05 mg/dL    Comment:   For patients > or = 58 years of age: The upper reference limit for Creatinine  is approximately 13% higher for people identified as African-American.      Total Bilirubin 0.7 0.2 - 1.2 mg/dL   Alkaline Phosphatase 59 33 - 130 U/L   AST 17 10 - 35 U/L   ALT 14 6 - 29 U/L   Total Protein 6.4 6.1 - 8.1 g/dL   Albumin 4.4 3.6 - 5.1 g/dL   Calcium 9.5 8.6 - 10.4 mg/dL   GFR, Est African American 83 >=60 mL/min   GFR, Est Non African American 72 >=60 mL/min  CBC with Differential/Platelet     Status: Abnormal   Collection Time: 09/27/16  8:10 AM  Result Value Ref Range   WBC 3.7 (L) 3.8 - 10.8 K/uL   RBC 4.30 3.80 - 5.10 MIL/uL   Hemoglobin 13.1 11.7 - 15.5 g/dL  HCT 40.5 35.0 - 45.0 %   MCV 94.2 80.0 - 100.0 fL   MCH 30.5 27.0 - 33.0 pg   MCHC 32.3 32.0 - 36.0 g/dL   RDW 13.7 11.0 - 15.0 %   Platelets 229 140 - 400 K/uL   MPV 9.7 7.5 - 12.5 fL   Neutro Abs 2,035 1,500 - 7,800 cells/uL   Lymphs Abs 1,147 850 - 3,900 cells/uL   Monocytes Absolute 370 200 - 950 cells/uL   Eosinophils Absolute 111 15 - 500 cells/uL   Basophils Absolute 37 0 - 200 cells/uL   Neutrophils Relative % 55 %   Lymphocytes Relative 31 %   Monocytes Relative 10 %   Eosinophils Relative 3 %   Basophils Relative 1 %   Smear Review Criteria for review not met   Lipid panel     Status: Abnormal   Collection Time: 09/27/16  8:10 AM  Result Value Ref Range   Cholesterol 222 (H) <200 mg/dL    Comment: ** Please note change in reference range(s). **      Triglycerides 57 <150 mg/dL    Comment: ** Please note change in reference range(s). **      HDL 95 >50 mg/dL    Comment: ** Please note change in reference range(s). **      Total CHOL/HDL Ratio 2.3 <5.0 Ratio   VLDL 11 <30 mg/dL   LDL Cholesterol 116 (H) <100 mg/dL    Comment: ** Please note change in reference range(s). **     Hemoglobin A1c     Status: None   Collection Time: 09/27/16  8:10 AM  Result Value Ref Range   Hgb A1c MFr Bld 5.0 <5.7 %    Comment:   For the purpose of screening for the presence of diabetes:    <5.7%       Consistent with the absence of diabetes 5.7-6.4 %   Consistent with increased risk for diabetes (prediabetes) >=6.5 %     Consistent with diabetes   This assay result is consistent with a decreased risk of diabetes.   Currently, no consensus exists regarding use of hemoglobin A1c for diagnosis of diabetes in children.   According to American Diabetes Association (ADA) guidelines, hemoglobin A1c <7.0% represents optimal control in non-pregnant diabetic patients. Different metrics may apply to specific patient populations. Standards of Medical Care in Diabetes (ADA).      Mean Plasma Glucose 97 mg/dL  VITAMIN D 25 Hydroxy (Vit-D Deficiency, Fractures)     Status: None   Collection Time: 09/27/16  8:10 AM  Result Value Ref Range   Vit D, 25-Hydroxy 51 30 - 100 ng/mL    Comment: Vitamin D Status           25-OH Vitamin D        Deficiency                <20 ng/mL        Insufficiency         20 - 29 ng/mL        Optimal             > or = 30 ng/mL   For 25-OH Vitamin D testing on patients on D2-supplementation and patients for whom quantitation of D2 and D3 fractions is required, the QuestAssureD 25-OH VIT D, (D2,D3), LC/MS/MS is recommended: order code [phone] (patients > 2 yrs).       PHQ2/9: Depression screen [facility] 2/9 09/29/2016 07/13/2016 01/13/2016  05/06/2015  Decreased Interest 0 0 - 2  Down, Depressed, Hopeless 0 0 3 2  PHQ - 2 Score 0 0 3 4  Altered sleeping - - 2 0  Tired, decreased energy - - 2 1  Change in appetite - - 0 0  Feeling bad or failure about yourself  - - 0 0  Trouble concentrating - - 1 1  Moving slowly or fidgety/restless - - 0 0  Suicidal thoughts - - 0 0  PHQ-9 Score - - 8 6  Difficult doing work/chores - - Somewhat difficult Somewhat difficult     Fall Risk: Fall Risk  09/29/2016 07/13/2016 01/13/2016 05/06/2015  Falls in the past year? No No No No     Functional Status Survey: Is the patient deaf or have difficulty hearing?:  No Does the patient have difficulty seeing, even when wearing glasses/contacts?: No Does the patient have difficulty concentrating, remembering, or making decisions?: No Does the patient have difficulty walking or climbing stairs?: No Does the patient have difficulty dressing or bathing?: No Does the patient have difficulty doing errands alone such as visiting a doctor's office or shopping?: No    Assessment & Plan  1. Well woman exam  Discussed importance of 150 minutes of physical activity weekly, eat two servings of fish weekly, eat one serving of tree nuts ( cashews, pistachios, pecans, almonds.Marland Kitchen) every other day, eat 6 servings of fruit/vegetables daily and drink plenty of water and avoid sweet beverages.   2. Cervical cancer screening  - Pap IG and HPV (high risk) DNA detection  3. Vaginal atrophy  - conjugated estrogens (PREMARIN) vaginal cream; Place 1 Applicatorful vaginally daily. For the first 2 weeks after that twice a week  Dispense: 42.5 g; Refill: 12  4. Breast lump on right side at 11 o'clock position  - US BREAST LTD UNI LEFT INC AXILLA; Future - MM Digital Diagnostic Bilat; Future  5. Cervical prolapse  - Ambulatory referral to Obstetrics / Gynecology

## 2016-09-29 NOTE — Addendum Note (Signed)
Addended by: Cynda FamiliaJOHNSON, Babita Amaker L on: 09/29/2016 03:17 PM   Modules accepted: Orders

## 2016-09-30 LAB — PAP IG AND HPV HIGH-RISK: HPV DNA HIGH RISK: NOT DETECTED

## 2016-09-30 NOTE — Addendum Note (Signed)
Addended by: Lelon FrohlichGOLDEN, TASHIA D on: 09/30/2016 09:34 AM   Modules accepted: Orders

## 2016-09-30 NOTE — Addendum Note (Signed)
Addended by: Lelon FrohlichGOLDEN, Forestine Macho D on: 09/30/2016 11:06 AM   Modules accepted: Orders

## 2016-09-30 NOTE — Addendum Note (Signed)
Addended by: Lelon FrohlichGOLDEN, Shalea Tomczak D on: 09/30/2016 09:32 AM   Modules accepted: Orders

## 2016-09-30 NOTE — Addendum Note (Signed)
Addended by: Wadie Liew D on: 09/30/2016 09:34 AM   Modules accepted: Orders  

## 2016-10-01 ENCOUNTER — Other Ambulatory Visit: Payer: Self-pay | Admitting: Family Medicine

## 2016-10-01 DIAGNOSIS — I7300 Raynaud's syndrome without gangrene: Secondary | ICD-10-CM

## 2016-10-25 DIAGNOSIS — N63 Unspecified lump in unspecified breast: Secondary | ICD-10-CM

## 2016-10-25 HISTORY — DX: Unspecified lump in unspecified breast: N63.0

## 2016-10-27 ENCOUNTER — Other Ambulatory Visit: Payer: Self-pay | Admitting: Family Medicine

## 2016-10-27 ENCOUNTER — Ambulatory Visit
Admission: RE | Admit: 2016-10-27 | Discharge: 2016-10-27 | Disposition: A | Discharge status: Home / Self Care | Payer: BLUE CROSS/BLUE SHIELD | Source: Ambulatory Visit | Attending: Family Medicine | Admitting: Family Medicine

## 2016-10-27 DIAGNOSIS — N6311 Unspecified lump in the right breast, upper outer quadrant: Secondary | ICD-10-CM | POA: Diagnosis present

## 2016-10-27 HISTORY — DX: Unspecified lump in unspecified breast: N63.0

## 2016-10-28 ENCOUNTER — Other Ambulatory Visit: Payer: Self-pay | Admitting: Family Medicine

## 2016-10-28 DIAGNOSIS — N6311 Unspecified lump in the right breast, upper outer quadrant: Secondary | ICD-10-CM

## 2016-11-04 ENCOUNTER — Encounter: Payer: Self-pay | Admitting: Family Medicine

## 2016-11-04 ENCOUNTER — Telehealth: Payer: Self-pay | Admitting: Family Medicine

## 2016-11-04 NOTE — Telephone Encounter (Signed)
Per Dr. Carlynn PurlSowles a dismissal letter has been mailed certified to patient Denise Rasmussen on 11/04/16 due to threatening behavior on the phone.  I have also alerted risk management to the situation.

## 2016-11-04 NOTE — Telephone Encounter (Signed)
Pt called wanting an appt and stated he was under the weather and wanted an appt with Dr Sherryll BurgerShah or whoever was available. Pt was informed that there were not any openings and was advised to go to ER or UC,

## 2016-12-03 ENCOUNTER — Other Ambulatory Visit: Payer: Self-pay | Admitting: Family Medicine

## 2016-12-03 NOTE — Telephone Encounter (Signed)
Patient requesting refill of Alprazolam to [facility].  

## 2016-12-15 ENCOUNTER — Other Ambulatory Visit: Payer: Self-pay | Admitting: Family Medicine

## 2016-12-18 ENCOUNTER — Other Ambulatory Visit: Payer: Self-pay | Admitting: Family Medicine

## 2016-12-27 DIAGNOSIS — F331 Major depressive disorder, recurrent, moderate: Secondary | ICD-10-CM | POA: Diagnosis not present

## 2016-12-29 DIAGNOSIS — F5105 Insomnia due to other mental disorder: Secondary | ICD-10-CM | POA: Diagnosis not present

## 2016-12-29 DIAGNOSIS — F349 Persistent mood [affective] disorder, unspecified: Secondary | ICD-10-CM | POA: Diagnosis not present

## 2017-01-10 DIAGNOSIS — F331 Major depressive disorder, recurrent, moderate: Secondary | ICD-10-CM | POA: Diagnosis not present

## 2017-01-11 ENCOUNTER — Ambulatory Visit: Payer: BLUE CROSS/BLUE SHIELD | Admitting: Family Medicine

## 2017-02-03 ENCOUNTER — Other Ambulatory Visit: Payer: Self-pay | Admitting: Family Medicine

## 2017-02-03 DIAGNOSIS — M542 Cervicalgia: Secondary | ICD-10-CM

## 2017-02-04 DIAGNOSIS — F331 Major depressive disorder, recurrent, moderate: Secondary | ICD-10-CM | POA: Diagnosis not present

## 2017-02-04 DIAGNOSIS — M19042 Primary osteoarthritis, left hand: Secondary | ICD-10-CM

## 2017-02-04 DIAGNOSIS — I10 Essential (primary) hypertension: Secondary | ICD-10-CM | POA: Diagnosis not present

## 2017-02-04 DIAGNOSIS — M19041 Primary osteoarthritis, right hand: Secondary | ICD-10-CM | POA: Diagnosis not present

## 2017-02-07 DIAGNOSIS — F331 Major depressive disorder, recurrent, moderate: Secondary | ICD-10-CM | POA: Diagnosis not present

## 2017-02-09 DIAGNOSIS — N8111 Cystocele, midline: Secondary | ICD-10-CM | POA: Diagnosis not present

## 2017-02-09 DIAGNOSIS — N814 Uterovaginal prolapse, unspecified: Secondary | ICD-10-CM | POA: Diagnosis not present

## 2017-02-28 DIAGNOSIS — F331 Major depressive disorder, recurrent, moderate: Secondary | ICD-10-CM | POA: Diagnosis not present

## 2017-03-14 DIAGNOSIS — N8111 Cystocele, midline: Secondary | ICD-10-CM | POA: Diagnosis not present

## 2017-03-14 DIAGNOSIS — N814 Uterovaginal prolapse, unspecified: Secondary | ICD-10-CM | POA: Diagnosis not present

## 2017-04-02 ENCOUNTER — Other Ambulatory Visit: Payer: Self-pay | Admitting: Family Medicine

## 2017-04-02 DIAGNOSIS — I7300 Raynaud's syndrome without gangrene: Secondary | ICD-10-CM

## 2017-04-04 DIAGNOSIS — F331 Major depressive disorder, recurrent, moderate: Secondary | ICD-10-CM | POA: Diagnosis not present

## 2017-04-12 DIAGNOSIS — F349 Persistent mood [affective] disorder, unspecified: Secondary | ICD-10-CM | POA: Diagnosis not present

## 2017-04-12 DIAGNOSIS — F5105 Insomnia due to other mental disorder: Secondary | ICD-10-CM | POA: Diagnosis not present

## 2017-04-21 DIAGNOSIS — N8111 Cystocele, midline: Secondary | ICD-10-CM | POA: Diagnosis not present

## 2017-04-21 DIAGNOSIS — N814 Uterovaginal prolapse, unspecified: Secondary | ICD-10-CM | POA: Diagnosis not present

## 2017-05-30 DIAGNOSIS — F349 Persistent mood [affective] disorder, unspecified: Secondary | ICD-10-CM | POA: Diagnosis not present

## 2017-05-30 DIAGNOSIS — F5105 Insomnia due to other mental disorder: Secondary | ICD-10-CM | POA: Diagnosis not present

## 2017-07-11 DIAGNOSIS — F331 Major depressive disorder, recurrent, moderate: Secondary | ICD-10-CM | POA: Diagnosis not present

## 2017-07-18 DIAGNOSIS — F5105 Insomnia due to other mental disorder: Secondary | ICD-10-CM | POA: Diagnosis not present

## 2017-07-18 DIAGNOSIS — F349 Persistent mood [affective] disorder, unspecified: Secondary | ICD-10-CM | POA: Diagnosis not present

## 2017-09-05 DIAGNOSIS — F331 Major depressive disorder, recurrent, moderate: Secondary | ICD-10-CM | POA: Diagnosis not present

## 2017-09-19 DIAGNOSIS — N8111 Cystocele, midline: Secondary | ICD-10-CM | POA: Diagnosis not present

## 2017-09-19 DIAGNOSIS — N898 Other specified noninflammatory disorders of vagina: Secondary | ICD-10-CM | POA: Diagnosis not present

## 2017-09-19 DIAGNOSIS — Z4689 Encounter for fitting and adjustment of other specified devices: Secondary | ICD-10-CM | POA: Diagnosis not present

## 2017-09-28 DIAGNOSIS — Z0000 Encounter for general adult medical examination without abnormal findings: Secondary | ICD-10-CM | POA: Diagnosis not present

## 2017-09-28 DIAGNOSIS — I10 Essential (primary) hypertension: Secondary | ICD-10-CM | POA: Diagnosis not present

## 2017-10-05 DIAGNOSIS — I10 Essential (primary) hypertension: Secondary | ICD-10-CM | POA: Diagnosis not present

## 2017-10-05 DIAGNOSIS — Z7185 Encounter for immunization safety counseling: Secondary | ICD-10-CM | POA: Insufficient documentation

## 2017-10-05 DIAGNOSIS — E7800 Pure hypercholesterolemia, unspecified: Secondary | ICD-10-CM | POA: Insufficient documentation

## 2017-10-05 DIAGNOSIS — Z0000 Encounter for general adult medical examination without abnormal findings: Secondary | ICD-10-CM | POA: Diagnosis not present

## 2017-10-05 DIAGNOSIS — Z7189 Other specified counseling: Secondary | ICD-10-CM | POA: Insufficient documentation

## 2017-10-31 DIAGNOSIS — F331 Major depressive disorder, recurrent, moderate: Secondary | ICD-10-CM | POA: Diagnosis not present

## 2017-11-01 DIAGNOSIS — F349 Persistent mood [affective] disorder, unspecified: Secondary | ICD-10-CM | POA: Diagnosis not present

## 2017-11-01 DIAGNOSIS — F5105 Insomnia due to other mental disorder: Secondary | ICD-10-CM | POA: Diagnosis not present

## 2017-12-12 DIAGNOSIS — F349 Persistent mood [affective] disorder, unspecified: Secondary | ICD-10-CM | POA: Diagnosis not present

## 2017-12-12 DIAGNOSIS — F5105 Insomnia due to other mental disorder: Secondary | ICD-10-CM | POA: Diagnosis not present

## 2017-12-21 DIAGNOSIS — F331 Major depressive disorder, recurrent, moderate: Secondary | ICD-10-CM | POA: Diagnosis not present

## 2018-01-05 DIAGNOSIS — Z01419 Encounter for gynecological examination (general) (routine) without abnormal findings: Secondary | ICD-10-CM | POA: Diagnosis not present

## 2018-01-05 DIAGNOSIS — Z1231 Encounter for screening mammogram for malignant neoplasm of breast: Secondary | ICD-10-CM | POA: Diagnosis not present

## 2018-01-06 ENCOUNTER — Other Ambulatory Visit: Payer: Self-pay | Admitting: Obstetrics and Gynecology

## 2018-01-06 DIAGNOSIS — Z1231 Encounter for screening mammogram for malignant neoplasm of breast: Secondary | ICD-10-CM

## 2018-02-03 ENCOUNTER — Ambulatory Visit
Admission: RE | Admit: 2018-02-03 | Discharge: 2018-02-03 | Disposition: A | Discharge status: Home / Self Care | Payer: BLUE CROSS/BLUE SHIELD | Source: Ambulatory Visit | Attending: Obstetrics and Gynecology | Admitting: Obstetrics and Gynecology

## 2018-02-03 DIAGNOSIS — Z1231 Encounter for screening mammogram for malignant neoplasm of breast: Secondary | ICD-10-CM | POA: Insufficient documentation

## 2018-02-21 DIAGNOSIS — B009 Herpesviral infection, unspecified: Secondary | ICD-10-CM | POA: Diagnosis not present

## 2018-02-21 DIAGNOSIS — I10 Essential (primary) hypertension: Secondary | ICD-10-CM | POA: Diagnosis not present

## 2018-04-13 DIAGNOSIS — F5105 Insomnia due to other mental disorder: Secondary | ICD-10-CM | POA: Diagnosis not present

## 2018-04-13 DIAGNOSIS — F349 Persistent mood [affective] disorder, unspecified: Secondary | ICD-10-CM | POA: Diagnosis not present

## 2018-05-04 DIAGNOSIS — R21 Rash and other nonspecific skin eruption: Secondary | ICD-10-CM | POA: Diagnosis not present

## 2018-05-04 DIAGNOSIS — Z113 Encounter for screening for infections with a predominantly sexual mode of transmission: Secondary | ICD-10-CM | POA: Diagnosis not present

## 2018-05-04 DIAGNOSIS — N898 Other specified noninflammatory disorders of vagina: Secondary | ICD-10-CM | POA: Diagnosis not present

## 2018-05-05 DIAGNOSIS — Z113 Encounter for screening for infections with a predominantly sexual mode of transmission: Secondary | ICD-10-CM | POA: Diagnosis not present

## 2018-05-05 DIAGNOSIS — I10 Essential (primary) hypertension: Secondary | ICD-10-CM | POA: Diagnosis not present

## 2018-05-05 DIAGNOSIS — E7800 Pure hypercholesterolemia, unspecified: Secondary | ICD-10-CM | POA: Diagnosis not present

## 2018-05-10 DIAGNOSIS — E7800 Pure hypercholesterolemia, unspecified: Secondary | ICD-10-CM | POA: Diagnosis not present

## 2018-05-10 DIAGNOSIS — F331 Major depressive disorder, recurrent, moderate: Secondary | ICD-10-CM | POA: Diagnosis not present

## 2018-05-10 DIAGNOSIS — Z0000 Encounter for general adult medical examination without abnormal findings: Secondary | ICD-10-CM | POA: Diagnosis not present

## 2018-05-10 DIAGNOSIS — B009 Herpesviral infection, unspecified: Secondary | ICD-10-CM | POA: Diagnosis not present

## 2018-05-10 DIAGNOSIS — I10 Essential (primary) hypertension: Secondary | ICD-10-CM | POA: Diagnosis not present

## 2018-05-10 DIAGNOSIS — Z131 Encounter for screening for diabetes mellitus: Secondary | ICD-10-CM | POA: Diagnosis not present

## 2018-05-16 DIAGNOSIS — F331 Major depressive disorder, recurrent, moderate: Secondary | ICD-10-CM | POA: Diagnosis not present

## 2018-06-22 ENCOUNTER — Encounter: Payer: Self-pay | Admitting: Adult Health

## 2018-06-22 ENCOUNTER — Ambulatory Visit: Payer: Self-pay | Admitting: Adult Health

## 2018-06-22 VITALS — BP 136/72 | HR 73 | Temp 98.1°F | Resp 16 | Ht 67.0 in | Wt 152.0 lb

## 2018-06-22 DIAGNOSIS — T148XXA Other injury of unspecified body region, initial encounter: Secondary | ICD-10-CM

## 2018-06-22 MED ORDER — CEPHALEXIN 500 MG PO CAPS: 500.0000 mg | ORAL_CAPSULE | Freq: Two times a day (BID) | ORAL | 0 refills | Status: DC

## 2018-06-22 NOTE — Progress Notes (Signed)
Subjective:     Patient ID: Denise Rasmussen, female   DOB: January 25, 1956, 62 y.o.   MRN: [id number]  HPI  Blood pressure 136/72, pulse 73, temperature 98.1 F (36.7 C), resp. rate 16, height 5\' 7"  (1.702 m), weight 152 lb (68.9 kg), SpO2 97 %. Patient is a 62 year old female in no acute distress who comes to the clinic for removal of splinter, and swelling red right index finger. Onset was two days ago when she was working in her yard cutting bushes. She feels she got a splinter in the right index finger. She has pain 5/10 in this right index finger. She also has a small splinter in her right posterior distal thumb- denies pain or redness swelling.  Shew denies any rose bushes.  Patient  denies any fever, body aches,chills, rash, chest pain, shortness of breath, nausea, vomiting, or diarrhea.   She denies any allergies.   Review of Systems  Constitutional: Negative.   HENT: Negative.   Eyes: Negative.   Gastrointestinal: Negative.   Genitourinary: Negative.   Musculoskeletal: Negative.   Skin: Positive for color change and wound. Negative for pallor and rash.  Neurological: Negative.   Hematological: Negative.   Psychiatric/Behavioral: Negative.        Objective:   Physical Exam  Constitutional: She is oriented to person, place, and time. She appears well-developed and well-nourished.  HENT:  Head: Normocephalic and atraumatic.  Neck: Normal range of motion. Neck supple.  Cardiovascular: Normal rate, regular rhythm, normal heart sounds and intact distal pulses. Exam reveals no gallop and no friction rub.  No murmur heard. Pulmonary/Chest: Effort normal and breath sounds normal. No stridor. No respiratory distress. She has no wheezes. She has no rales. She exhibits no tenderness.  Musculoskeletal: Normal range of motion.  Neurological: She is alert and oriented to person, place, and time.  Skin: Skin is warm and dry. Capillary refill takes less than 2 seconds. No rash noted. There is  erythema (right index finger palmer side at bend of digit ). No pallor. Nails show no clubbing.  Right index finger palmar side with small splinter visualized, swelling localized to around splinter and erythema localized around splinter as well.   Right thumb distal with small splinter visualized no erythema, drainage.   Psychiatric: She has a normal mood and affect. Her behavior is normal. Thought content normal.  Vitals reviewed.  Right index finger splinter as documented above removed using sterile techniques. Cleaned with alcohol and betadine. Patient requested analgesia. Lidocaine 1% 50 mg/80ml (10mg /ml) injected locally 0.53ml with sterile 25 gauge needle and syringe ( Batch [id number] expiration 08/2018). Analgesia effect after 5 minutes. Waited 5 minutes and then superficial horizontal approximately 0.3 mmsplinter was removed with sterile blade #  Pinpoint break in skin to access splinter and removed easily with sterile tweezers.When skin was broken with scapel yellow pus small amount released from area.  Patient tolerated without any difficulty and no change in skin color. Radial pulse 2+ . No other foreign body noted. Flushed with normal saline, cleaned with betadine, triple antibiotic and gauze wrap dressing applied. Hemostasis gained.   Right thumb splinter also easily removed after cleaning skin with alcohol and betadine with sterile technique and tweezers only. Patient tolerated without any difficulty. Triple antibiotic and band aid applied after flushing with normal saline and cleaning with alcohol. No other foreign body noted.     Assessment:     Splinter in skin      Plan:  Meds ordered this encounter  Medications  . cephALEXin (KEFLEX) 500 MG capsule    Sig: Take 1 capsule (500 mg total) by mouth 2 (two) times daily.    Dispense:  20 capsule    Refill:  0   Discussed wound care.    Reviewed After Visit Summary ( AVS) with patient.  Return to clinic in one week for  recheck sooner if needed as discussed for worsening infection symptoms or any changes.   Advised patient call the office or your primary care doctor for an appointment if no improvement within 72 hours or if any symptoms change or worsen at any time  Advised ER or urgent Care if after hours or on weekend. Call 911 for emergency symptoms at any time.Patinet verbalized understanding of all instructions given/reviewed and treatment plan and has no further questions or concerns at this time.    Patient verbalized understanding of all instructions given and denies any further questions at this time.

## 2018-06-22 NOTE — Patient Instructions (Signed)
Sliver Removal, Care After A sliver-also called a splinter-is a small and thin broken piece of an object that gets stuck (embedded) under the skin. A sliver can create a deep wound that can easily become infected. It is important to care for the wound after a sliver is removed to help prevent infection and other problems from developing. What can I expect after the procedure? Slivers often break into smaller pieces when they are removed. If pieces of your sliver broke off and stayed in your skin, you will eventually see them working themselves out and you may feel some pain at the wound site. This is normal. Follow these instructions at home:  Keep all follow-up visits as directed by your health care provider. This is important.  There are many different ways to close and cover a wound, including stitches (sutures) and adhesive strips. Follow your health care provider's instructions about: ? Wound care. ? Bandage (dressing) changes and removal. ? Wound closure removal.  Check the wound site every day for signs of infection. Watch for: ? Red streaks coming from the wound. ? Fever. ? Redness or tenderness around the wound. ? Fluid, blood, or pus coming from the wound. ? A bad smell coming from the wound. Contact a health care provider if:  You think that a piece of the sliver is still in your skin.  Your wound was closed, as with sutures, and the edges of the wound break open.  You have signs of infection, including: ? New or worsening redness around the wound. ? New or worsening tenderness around the wound. ? Fluid, blood, or pus coming from the wound. ? A bad smell coming from the wound or dressing. Get help right away if: You have any of the following signs of infection:  Red streaks coming from the wound.  An unexplained fever.  This information is not intended to replace advice given to you by your health care provider. Make sure you discuss any questions you have with your  health care provider. Document Released: 10/08/2000 Document Revised: 06/06/2016 Document Reviewed: 06/13/2014 Elsevier Interactive Patient Education  2018 Elsevier Inc.  

## 2018-07-12 DIAGNOSIS — F349 Persistent mood [affective] disorder, unspecified: Secondary | ICD-10-CM | POA: Diagnosis not present

## 2018-07-12 DIAGNOSIS — F5105 Insomnia due to other mental disorder: Secondary | ICD-10-CM | POA: Diagnosis not present

## 2018-07-18 DIAGNOSIS — F331 Major depressive disorder, recurrent, moderate: Secondary | ICD-10-CM | POA: Diagnosis not present

## 2018-09-11 DIAGNOSIS — F331 Major depressive disorder, recurrent, moderate: Secondary | ICD-10-CM | POA: Diagnosis not present

## 2018-10-09 DIAGNOSIS — F331 Major depressive disorder, recurrent, moderate: Secondary | ICD-10-CM | POA: Diagnosis not present

## 2018-10-11 DIAGNOSIS — F349 Persistent mood [affective] disorder, unspecified: Secondary | ICD-10-CM | POA: Diagnosis not present

## 2018-10-11 DIAGNOSIS — F5105 Insomnia due to other mental disorder: Secondary | ICD-10-CM | POA: Diagnosis not present

## 2018-10-26 DIAGNOSIS — J069 Acute upper respiratory infection, unspecified: Secondary | ICD-10-CM | POA: Diagnosis not present

## 2018-11-03 DIAGNOSIS — R7303 Prediabetes: Secondary | ICD-10-CM | POA: Diagnosis not present

## 2018-11-03 DIAGNOSIS — E7800 Pure hypercholesterolemia, unspecified: Secondary | ICD-10-CM | POA: Diagnosis not present

## 2018-11-03 DIAGNOSIS — I10 Essential (primary) hypertension: Secondary | ICD-10-CM | POA: Diagnosis not present

## 2018-11-03 DIAGNOSIS — B009 Herpesviral infection, unspecified: Secondary | ICD-10-CM | POA: Diagnosis not present

## 2018-11-06 DIAGNOSIS — F331 Major depressive disorder, recurrent, moderate: Secondary | ICD-10-CM | POA: Diagnosis not present

## 2018-11-10 DIAGNOSIS — Z0000 Encounter for general adult medical examination without abnormal findings: Secondary | ICD-10-CM | POA: Diagnosis not present

## 2018-11-10 DIAGNOSIS — I10 Essential (primary) hypertension: Secondary | ICD-10-CM | POA: Diagnosis not present

## 2018-11-10 DIAGNOSIS — R7303 Prediabetes: Secondary | ICD-10-CM | POA: Diagnosis not present

## 2018-11-10 DIAGNOSIS — E7800 Pure hypercholesterolemia, unspecified: Secondary | ICD-10-CM | POA: Diagnosis not present

## 2019-01-19 DIAGNOSIS — F349 Persistent mood [affective] disorder, unspecified: Secondary | ICD-10-CM | POA: Diagnosis not present

## 2019-01-19 DIAGNOSIS — F5105 Insomnia due to other mental disorder: Secondary | ICD-10-CM | POA: Diagnosis not present

## 2019-03-23 DIAGNOSIS — Z03818 Encounter for observation for suspected exposure to other biological agents ruled out: Secondary | ICD-10-CM | POA: Diagnosis not present

## 2019-04-24 DIAGNOSIS — F349 Persistent mood [affective] disorder, unspecified: Secondary | ICD-10-CM | POA: Diagnosis not present

## 2019-04-24 DIAGNOSIS — F5105 Insomnia due to other mental disorder: Secondary | ICD-10-CM | POA: Diagnosis not present

## 2019-04-25 DIAGNOSIS — Z20828 Contact with and (suspected) exposure to other viral communicable diseases: Secondary | ICD-10-CM | POA: Diagnosis not present

## 2019-05-04 DIAGNOSIS — E7800 Pure hypercholesterolemia, unspecified: Secondary | ICD-10-CM | POA: Diagnosis not present

## 2019-05-04 DIAGNOSIS — R7303 Prediabetes: Secondary | ICD-10-CM | POA: Diagnosis not present

## 2019-05-04 DIAGNOSIS — I10 Essential (primary) hypertension: Secondary | ICD-10-CM | POA: Diagnosis not present

## 2019-05-11 DIAGNOSIS — E7800 Pure hypercholesterolemia, unspecified: Secondary | ICD-10-CM | POA: Diagnosis not present

## 2019-05-11 DIAGNOSIS — Z1211 Encounter for screening for malignant neoplasm of colon: Secondary | ICD-10-CM | POA: Diagnosis not present

## 2019-05-11 DIAGNOSIS — B009 Herpesviral infection, unspecified: Secondary | ICD-10-CM | POA: Diagnosis not present

## 2019-05-11 DIAGNOSIS — I10 Essential (primary) hypertension: Secondary | ICD-10-CM | POA: Diagnosis not present

## 2019-07-09 DIAGNOSIS — Z1211 Encounter for screening for malignant neoplasm of colon: Secondary | ICD-10-CM | POA: Diagnosis not present

## 2019-07-09 DIAGNOSIS — Z01818 Encounter for other preprocedural examination: Secondary | ICD-10-CM | POA: Diagnosis not present

## 2019-07-11 DIAGNOSIS — F5105 Insomnia due to other mental disorder: Secondary | ICD-10-CM | POA: Diagnosis not present

## 2019-07-11 DIAGNOSIS — F349 Persistent mood [affective] disorder, unspecified: Secondary | ICD-10-CM | POA: Diagnosis not present

## 2019-08-01 ENCOUNTER — Ambulatory Visit: Payer: Self-pay

## 2019-08-01 DIAGNOSIS — Z01818 Encounter for other preprocedural examination: Secondary | ICD-10-CM | POA: Diagnosis not present

## 2019-08-06 DIAGNOSIS — K5730 Diverticulosis of large intestine without perforation or abscess without bleeding: Secondary | ICD-10-CM | POA: Diagnosis not present

## 2019-08-06 DIAGNOSIS — K640 First degree hemorrhoids: Secondary | ICD-10-CM | POA: Diagnosis not present

## 2019-08-06 DIAGNOSIS — K635 Polyp of colon: Secondary | ICD-10-CM | POA: Diagnosis not present

## 2019-08-06 DIAGNOSIS — Z1211 Encounter for screening for malignant neoplasm of colon: Secondary | ICD-10-CM | POA: Diagnosis not present

## 2019-08-15 ENCOUNTER — Ambulatory Visit: Payer: Self-pay

## 2019-08-15 ENCOUNTER — Other Ambulatory Visit: Payer: Self-pay

## 2019-08-15 DIAGNOSIS — Z23 Encounter for immunization: Secondary | ICD-10-CM

## 2019-09-16 DIAGNOSIS — I10 Essential (primary) hypertension: Secondary | ICD-10-CM | POA: Diagnosis not present

## 2019-09-16 DIAGNOSIS — H5789 Other specified disorders of eye and adnexa: Secondary | ICD-10-CM | POA: Diagnosis not present

## 2019-10-04 DIAGNOSIS — H348322 Tributary (branch) retinal vein occlusion, left eye, stable: Secondary | ICD-10-CM | POA: Diagnosis not present

## 2019-12-08 ENCOUNTER — Ambulatory Visit: Payer: Self-pay | Attending: Internal Medicine

## 2019-12-08 DIAGNOSIS — Z23 Encounter for immunization: Secondary | ICD-10-CM | POA: Insufficient documentation

## 2019-12-08 NOTE — Progress Notes (Signed)
   Covid-19 Vaccination Clinic  Name:  Denise Rasmussen    MRN: [id number] DOB: 1956/06/02  12/08/2019  [name] was observed post Covid-19 immunization for 15 minutes without incidence. She was provided with Vaccine Information Sheet and instruction to access the V-Safe system.   [name] was instructed to call 911 with any severe reactions post vaccine: Marland Kitchen Difficulty breathing  . Swelling of your face and throat  . A fast heartbeat  . A bad rash all over your body  . Dizziness and weakness    Immunizations Administered    Name Date Dose VIS Date Route   Pfizer COVID-19 Vaccine 12/08/2019 12:40 PM 0.3 mL 10/05/2019 Intramuscular   Manufacturer: [employer], [employer]   Lot: [id number]   NDC: [address]-2

## 2019-12-29 ENCOUNTER — Ambulatory Visit: Payer: Self-pay | Attending: Internal Medicine

## 2019-12-29 DIAGNOSIS — Z23 Encounter for immunization: Secondary | ICD-10-CM | POA: Insufficient documentation

## 2019-12-29 NOTE — Progress Notes (Signed)
   Covid-19 Vaccination Clinic  Name:  Denise Rasmussen    MRN: [id number] DOB: 11-17-1955  12/29/2019  [name] was observed post Covid-19 immunization for 15 minutes without incident. She was provided with Vaccine Information Sheet and instruction to access the V-Safe system.   [name] was instructed to call 911 with any severe reactions post vaccine: Marland Kitchen Difficulty breathing  . Swelling of face and throat  . A fast heartbeat  . A bad rash all over body  . Dizziness and weakness   Immunizations Administered    Name Date Dose VIS Date Route   Pfizer COVID-19 Vaccine 12/29/2019  9:30 AM 0.3 mL 10/05/2019 Intramuscular   Manufacturer: [employer], [employer]   Lot: [id number]   NDC: [address]-4

## 2020-01-22 ENCOUNTER — Ambulatory Visit: Payer: Self-pay | Admitting: *Deleted

## 2020-01-22 ENCOUNTER — Other Ambulatory Visit: Payer: Self-pay

## 2020-01-22 ENCOUNTER — Telehealth: Payer: Self-pay | Admitting: Medical

## 2020-01-22 DIAGNOSIS — Z20822 Contact with and (suspected) exposure to covid-19: Secondary | ICD-10-CM

## 2020-01-22 LAB — POC COVID19 BINAXNOW: SARS Coronavirus 2 Ag: NEGATIVE

## 2020-01-22 NOTE — Progress Notes (Signed)
64 yo female in non acute distress. Permission to do telemedicine appointjment. Comes to clinic for Covid-19 testing.Started on Sunday feels that it was a stomach bug, many with GI symptoms , friends. All have had the vaccine at least 2 weeks ago. . NO SOB or CP. Fatigue today, not like this weekend and all other symptoms have resolved.  She also has had the pfizer vaccine with 2nd dose on 12/29/2019.       PE: not preformed due to telemedicine appointment. Physical Exam  Constitutional: She is oriented to person, place, and time.  Abdominal: There is no abdominal tenderness.  Per patient  Neurological: She is alert and oriented to person, place, and time. GCS score is 15.  Psychiatric: Mood, memory, affect and judgment normal.      POC Covid-19 negative PCR Covid-19 pending.  A/P Fatigue , but improving Needs Covid PCR test to check if she is positive, for return to work status. If negative she may return to work. If positive she will need to be out of work 10vdays since the onset of symptoms.Will contact patient once we have the PCR results.

## 2020-01-24 LAB — SARS-COV-2, NAA 2 DAY TAT

## 2020-01-24 LAB — NOVEL CORONAVIRUS, NAA: SARS-CoV-2, NAA: NOT DETECTED

## 2020-01-25 ENCOUNTER — Telehealth: Payer: Self-pay

## 2020-01-25 NOTE — Telephone Encounter (Signed)
Patient Covid PCR test is negative.  Patient notified that she can return to work.  RTW  note sent to [employer].

## 2020-06-04 ENCOUNTER — Other Ambulatory Visit: Payer: Self-pay

## 2020-06-09 ENCOUNTER — Encounter: Payer: Self-pay | Admitting: Medical

## 2020-06-09 ENCOUNTER — Telehealth: Payer: Self-pay | Admitting: Medical

## 2020-06-09 ENCOUNTER — Ambulatory Visit: Payer: Self-pay | Admitting: *Deleted

## 2020-06-09 ENCOUNTER — Other Ambulatory Visit: Payer: Self-pay

## 2020-06-09 DIAGNOSIS — Z20822 Contact with and (suspected) exposure to covid-19: Secondary | ICD-10-CM

## 2020-06-09 LAB — POC COVID19 BINAXNOW: SARS Coronavirus 2 Ag: NEGATIVE

## 2020-06-09 NOTE — Progress Notes (Signed)
   Subjective:    Patient ID: Denise Rasmussen, female    DOB: 08/11/1956, 64 y.o.   MRN: [id number]  HPI 64 yo female in non acute distress. Exposed last week to positive Covid-19 person at work.  Horrible  Pain on the back  One day last week. ? Wrong  Bra, took  Tylenol little relief and then   Advil on Friday  Which seemed to help.   Back pain  No changse on movement,no pain with deep breaths.  Left side of upper back pain.    Tested ( + ) Covid-19 antibodies. Vaccine March  6th Pfizer.  Bought a test kitat [employer]  [place] Covid-19   History of Raynaud' in feets Had trouble keeping them warm one day last week was at daughter in laws house and could not get her feet warm. Just occurred when he laid down.  Review of Systems  Constitutional: Positive for fever. Negative for chills.  HENT: Positive for ear discharge (sore ears yesterday) and sore throat (scratchy). Negative for postnasal drip and rhinorrhea.   Respiratory: Negative for cough, chest tightness, shortness of breath and wheezing.   Cardiovascular: Negative for chest pain.  Gastrointestinal: Negative for abdominal pain, nausea and vomiting.  Musculoskeletal: Positive for back pain (left side of back , history of scolosis.  takes BP pill).  Skin: Negative for rash.  Neurological: Negative for dizziness, seizures, syncope, facial asymmetry, light-headedness and headaches.    Dr. [facility]ton [facility] is her PCP.    History  Of  Slight scoliosis. Objective:   Physical Exam  AXOX3 No physical was performed due to telemedicine visit.      Assessment & Plan:  Encounter for screening laboratory testing for Covid-19 in asymptomatic patient. Reviewed with patient that she results will appear in her MyChart.  If she has any questions to please call the office. She verbalizes understanding and has no questions at the end our conversation.

## 2020-06-09 NOTE — Patient Instructions (Signed)

## 2020-06-11 LAB — SARS-COV-2, NAA 2 DAY TAT

## 2020-06-11 LAB — NOVEL CORONAVIRUS, NAA: SARS-CoV-2, NAA: NOT DETECTED

## 2020-06-30 ENCOUNTER — Other Ambulatory Visit: Payer: Self-pay

## 2020-06-30 ENCOUNTER — Encounter: Payer: Self-pay | Admitting: Medical

## 2020-06-30 ENCOUNTER — Telehealth: Payer: Self-pay | Admitting: Medical

## 2020-06-30 ENCOUNTER — Ambulatory Visit: Payer: Self-pay | Admitting: *Deleted

## 2020-06-30 DIAGNOSIS — J069 Acute upper respiratory infection, unspecified: Secondary | ICD-10-CM

## 2020-06-30 DIAGNOSIS — R059 Cough, unspecified: Secondary | ICD-10-CM

## 2020-06-30 DIAGNOSIS — Z20822 Contact with and (suspected) exposure to covid-19: Secondary | ICD-10-CM

## 2020-06-30 LAB — POC COVID19 BINAXNOW: SARS Coronavirus 2 Ag: NEGATIVE

## 2020-06-30 MED ORDER — AZITHROMYCIN 250 MG PO TABS: ORAL_TABLET | ORAL | 0 refills | Status: DC

## 2020-06-30 MED ORDER — BENZONATATE 100 MG PO CAPS: ORAL_CAPSULE | ORAL | 0 refills | Status: DC

## 2020-06-30 NOTE — Progress Notes (Signed)
° °  Subjective:    Patient ID: Denise Rasmussen, female    DOB: 1955-12-03, 64 y.o.   MRN: [id number]  HPI  See nursing note, accidentally put under nurses note.  Review of Systems     Objective:   Physical Exam        Assessment & Plan:

## 2020-06-30 NOTE — Progress Notes (Signed)
   Subjective:    Patient ID: Denise Rasmussen, female    DOB: 1956/01/19, 64 y.o.   MRN: [id number]  HPI Symptoms started yesterday coughing nonproductive.  Flew [place] 27, 28, 29 rt 30th. Took Tylenol 650mg  nasal spray "Like Afrin"  NKDA  Vaccinated Pfizer last date of injection December 29, 2019.  Review of Systems  Constitutional: Positive for fatigue and fever (99.3 usually 98.4). Negative for chills.  HENT: Positive for congestion, rhinorrhea (clear), sneezing and sore throat. Negative for ear pain (full feeling), sinus pressure (under eyes puffy) and sinus pain.   Respiratory: Positive for cough. Negative for chest tightness, shortness of breath and wheezing.   Cardiovascular: Negative for chest pain.  Gastrointestinal: Negative for abdominal pain, diarrhea, nausea and vomiting.  Genitourinary: Negative for difficulty urinating.  Musculoskeletal: Negative for myalgias.  Skin: Negative for rash.  Allergic/Immunologic: Positive for environmental allergies (started on Zyrtec yesterday).  Neurological: Negative for dizziness, syncope and headaches.   Feels like it is in her chest.    Objective:   Physical Exam    POC Covid-19 Negative PCR Covid-19 pending     Assessment & Plan:

## 2020-07-02 ENCOUNTER — Telehealth: Payer: Self-pay | Admitting: Registered Nurse

## 2020-07-02 LAB — NOVEL CORONAVIRUS, NAA: SARS-CoV-2, NAA: NOT DETECTED

## 2020-07-02 NOTE — Telephone Encounter (Signed)
Patient seen by PA Ratcliffe 06/30/2020.  PCR to [facility] still pending results.  PA Ratcliffe asked for me to follow up with patient.  Patient contacted via telephone and reported she picked up the antibiotic and cough medicine yesterday as sinuses worsening.  Pharmacy was closed on Monday 06/30/2020.  Using nasal saline prn.  Still having congestion, post nasal drip, sinus pain and pressure and cough.  T99.49F digital oral thermometer today.  Taking tylenol and not having any body aches/feeling cold now.  Patient with frequent mild cough and nasal congestion during telephone conversation.  Alert and oriented x 3.  Respirations even and unlabored denied new symptoms/diarrhea/vomiting/chest pain/wheezing/dyspnea.  Rapid covid POCT negative ESW 06/30/2020.  Reminded patient she will be able to see PCR results in mychart once [facility] transmits to system.  PA Ratcliffe working tomorrow and I am scheduled again Friday.  Continue quarantine at home.  Patient verbalized understanding information/instructions, agreed with plan of care and had no further questions at this time.

## 2020-07-03 ENCOUNTER — Telehealth: Payer: Self-pay | Admitting: Medical

## 2020-07-03 ENCOUNTER — Other Ambulatory Visit: Payer: Self-pay

## 2020-07-03 DIAGNOSIS — R059 Cough, unspecified: Secondary | ICD-10-CM

## 2020-07-03 DIAGNOSIS — J9801 Acute bronchospasm: Secondary | ICD-10-CM

## 2020-07-03 MED ORDER — ALBUTEROL SULFATE HFA 108 (90 BASE) MCG/ACT IN AERS: 2.0000 | INHALATION_SPRAY | Freq: Four times a day (QID) | RESPIRATORY_TRACT | 2 refills | Status: DC | PRN

## 2020-07-03 MED ORDER — BENZONATATE 100 MG PO CAPS: ORAL_CAPSULE | ORAL | 0 refills | Status: DC

## 2020-07-07 NOTE — Progress Notes (Signed)
Subjective:    Patient ID: Denise Rasmussen, female    DOB: 1956-02-08, 64 y.o.   MRN: [id number]  HPI 64 yo female in non acute distress, consents to telemedicine appointment. Overall patient states she is doing much better , but still has a cough.   Review of Systems  Constitutional: Negative for chills and fever.  Respiratory: Positive for cough and chest tightness. Negative for wheezing.   Cardiovascular: Negative for chest pain.  All other systems reviewed and are negative.      Objective:   Physical Exam AXOX3 No physical was performed due to telemedicine appointment.  She is able to speak in complete sentences, no labored breathing sounds. No cough noted on phone call.     . Recent Results (from the past 2160 hour(s))  Novel Coronavirus, NAA ([facility])     Status: None   Collection Time: 06/09/20  1:45 PM   Specimen: Nasopharyngeal(NP) swabs in vial transport medium   Nasopharynge  Is this  Result Value Ref Range   SARS-CoV-2, NAA Not Detected Not Detected    Comment: This nucleic acid amplification test was developed and its performance characteristics determined by [employer]. Nucleic acid amplification tests include RT-PCR and TMA. This test has not been FDA cleared or approved. This test has been authorized by FDA under an Emergency Use Authorization (EUA). This test is only authorized for the duration of time the declaration that circumstances exist justifying the authorization of the emergency use of in vitro diagnostic tests for detection of SARS-CoV-2 virus and/or diagnosis of COVID-19 infection under section 564(b)(1) of the Act, 21 U.S.C. 160VPX-1(G) (1), unless the authorization is terminated or revoked sooner. When diagnostic testing is negative, the possibility of a false negative result should be considered in the context of a patient's recent exposures and the presence of clinical signs and symptoms consistent with COVID-19. An individual  without symptoms of COVID-19 and who is not shedding SARS-CoV-2 virus wo uld expect to have a negative (not detected) result in this assay.   SARS-COV-2, NAA 2 DAY TAT     Status: None   Collection Time: 06/09/20  1:45 PM   Nasopharynge  Is this  Result Value Ref Range   SARS-CoV-2, NAA 2 DAY TAT Performed   POC COVID-19     Status: Normal   Collection Time: 06/09/20  2:01 PM  Result Value Ref Range   SARS Coronavirus 2 Ag Negative Negative    Comment: Pt aware. Virtual visit completed with H.Johnita Palleschi PAC. PCR sent.   Novel Coronavirus, NAA ([facility])     Status: None   Collection Time: 06/30/20  2:47 PM   Specimen: Nasopharyngeal(NP) swabs in vial transport medium   Nasopharynge  Is this  Result Value Ref Range   SARS-CoV-2, NAA Not Detected Not Detected    Comment: This nucleic acid amplification test was developed and its performance characteristics determined by [employer]. Nucleic acid amplification tests include RT-PCR and TMA. This test has not been FDA cleared or approved. This test has been authorized by FDA under an Emergency Use Authorization (EUA). This test is only authorized for the duration of time the declaration that circumstances exist justifying the authorization of the emergency use of in vitro diagnostic tests for detection of SARS-CoV-2 virus and/or diagnosis of COVID-19 infection under section 564(b)(1) of the Act, 21 U.S.C. 626RSW-5(I) (1), unless the authorization is terminated or revoked sooner. When diagnostic testing is negative, the possibility of a false negative result should be  considered in the context of a patient's recent exposures and the presence of clinical signs and symptoms consistent with COVID-19. An individual without symptoms of COVID-19 and who is not shedding SARS-CoV-2 virus wo uld expect to have a negative (not detected) result in this assay.   POC COVID-19     Status: Normal   Collection Time: 06/30/20  2:48 PM  Result  Value Ref Range   SARS Coronavirus 2 Ag Negative Negative    Comment: Pt aware. PCR sent. Virtual visit completed with H.Letetia Romanello PAC.     Assessment & Plan:  Cough Meds ordered this encounter  Medications  . benzonatate (TESSALON PERLES) 100 MG capsule    Sig: take  2 capsules by mouth every 8 hours as needed for cough    Dispense:  30 capsule    Refill:  0  . albuterol (VENTOLIN HFA) 108 (90 Base) MCG/ACT inhaler    Sig: Inhale 2 puffs into the lungs every 6 (six) hours as needed for wheezing or shortness of breath. Please give Ventolin    Dispense:  8 g    Refill:  2  reviwed with patient she needs not need to wait the 10 days to return to work since her Covid-19 test was negative.  She must have improvement of her symptoms and be fever free x24 hours with no OTC Motrin or Tylenol.  Return to the clinic as needed patient verbalizes understanding and has no further questions at the end of our conversation.

## 2020-07-31 ENCOUNTER — Other Ambulatory Visit: Payer: Self-pay | Admitting: Obstetrics and Gynecology

## 2020-07-31 DIAGNOSIS — Z1231 Encounter for screening mammogram for malignant neoplasm of breast: Secondary | ICD-10-CM

## 2020-08-06 ENCOUNTER — Inpatient Hospital Stay: Payer: BC Managed Care – PPO | Attending: Obstetrics and Gynecology | Admitting: Obstetrics and Gynecology

## 2020-08-06 ENCOUNTER — Other Ambulatory Visit: Payer: Self-pay

## 2020-08-06 DIAGNOSIS — M19042 Primary osteoarthritis, left hand: Secondary | ICD-10-CM | POA: Diagnosis not present

## 2020-08-06 DIAGNOSIS — E559 Vitamin D deficiency, unspecified: Secondary | ICD-10-CM | POA: Insufficient documentation

## 2020-08-06 DIAGNOSIS — Z79899 Other long term (current) drug therapy: Secondary | ICD-10-CM | POA: Diagnosis not present

## 2020-08-06 DIAGNOSIS — G4700 Insomnia, unspecified: Secondary | ICD-10-CM | POA: Diagnosis not present

## 2020-08-06 DIAGNOSIS — I10 Essential (primary) hypertension: Secondary | ICD-10-CM | POA: Diagnosis not present

## 2020-08-06 DIAGNOSIS — E7800 Pure hypercholesterolemia, unspecified: Secondary | ICD-10-CM | POA: Diagnosis not present

## 2020-08-06 DIAGNOSIS — M19041 Primary osteoarthritis, right hand: Secondary | ICD-10-CM | POA: Insufficient documentation

## 2020-08-06 DIAGNOSIS — N903 Dysplasia of vulva, unspecified: Secondary | ICD-10-CM | POA: Diagnosis not present

## 2020-08-06 DIAGNOSIS — I7300 Raynaud's syndrome without gangrene: Secondary | ICD-10-CM | POA: Insufficient documentation

## 2020-08-06 DIAGNOSIS — B009 Herpesviral infection, unspecified: Secondary | ICD-10-CM | POA: Diagnosis not present

## 2020-08-06 DIAGNOSIS — Z780 Asymptomatic menopausal state: Secondary | ICD-10-CM | POA: Diagnosis not present

## 2020-08-06 DIAGNOSIS — F339 Major depressive disorder, recurrent, unspecified: Secondary | ICD-10-CM | POA: Diagnosis not present

## 2020-08-06 NOTE — H&P (Signed)
Gynecologic Oncology Consult Visit   Referring Provider: Dr. 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   07/01/2020 vulvar nodules-3 small white verrucal lesions of the posterior fourchette -biopsies performed x 3.  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  Uses pessary for symptoms.  She is postmenopausal at age 52.  Denies history of abnormal Pap smears. G2P2    Problem List: Patient Active Problem List   Diagnosis Date Noted  . 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 Pure hypercholesterolemia 10/05/2017  . Vaccine counseling 10/05/2017  . Essential hypertension 02/04/2017  . Moderate episode of recurrent major depressive disorder (HCC) 02/04/2017  . Primary osteoarthritis of both hands 02/04/2017  . Ganglion cyst 07/01/2016  . Hand pain 06/30/2016  . HSV infection 05/03/2015  . Depression, major, single episode, mild (HCC) 05/03/2015  . Arthritis, degenerative 05/03/2015  . Post menopausal syndrome 05/03/2015  . Borderline diabetes mellitus 05/03/2015  . Raynaud's syndrome 05/03/2015  . Vitamin D deficiency 05/03/2015  . Cervical pain 07/26/2008  . Insomnia 01/24/2008    Past Medical History: Past Medical History:  Diagnosis Date  . Allergy   . Arthritis   . Breast mass 10/2016   right  . Hypertension     Past Surgical History: History reviewed. No pertinent surgical history.  Past Gynecologic History:  Menarche: 14 History of Abnormal pap: No Last pap: as per HPI Sexually active: yes  OB History:  OB History  No obstetric history on file.    Family History: Family History  Problem Relation Age of Onset  . Breast cancer Maternal Grandmother   . Hypertension Mother    . Diabetes Mother   . Heart disease Mother     Social History: Social History   Socioeconomic History  . Marital status: Widowed    Spouse name: Not on file  . Number of children: Not on file  . Years of education: Not on file  . Highest education level: Not on file  Occupational History  . Not on file  Tobacco Use  . Smoking status: Never Smoker  . Smokeless tobacco: Never Used  Vaping Use  . Vaping Use: Never used  Substance and Sexual Activity  . Alcohol use: Yes    Alcohol/week: 0.0 standard drinks    Comment: occasionally  . Drug use: No  . Sexual activity: Yes  Other Topics Concern  . Not on file  Social History Narrative  . Not on file   Social Determinants of Health   Financial Resource Strain:   . Difficulty of Paying Living Expenses: Not on file  Food Insecurity:   . Worried About Running Out of Food in the Last Year: Not on file  . Ran Out of Food in the Last Year: Not on file  Transportation Needs:   . Lack of Transportation (Medical): Not on file  . Lack of Transportation (Non-Medical): Not on file  Physical Activity:   . Days of Exercise per Week: Not on file  . Minutes of Exercise per Session: Not on file  Stress:   . Feeling of Stress : Not on file  Social Connections:   . Frequency of Communication with Friends and Family: Not on file  .   Frequency of Social Gatherings with Friends and Family: Not on file  . Attends Religious Services: Not on file  . Active Member of Clubs or Organizations: Not on file  . Attends Banker Meetings: Not on file  . Marital Status: Not on file  Intimate Partner Violence:   . Fear of Current or Ex-Partner: Not on file  . Emotionally Abused: Not on file  . Physically Abused: Not on file  . Sexually Abused: Not on file    Allergies: Allergies  Allergen Reactions  . Other     NKDA per patient    Current Medications: Current Outpatient Medications  Medication Sig Dispense Refill  . ALPRAZolam  (XANAX) 0.5 MG tablet TAKE 1 TABLET BY MOUTH AS NEEDED FOR ANXIETY 30 tablet 0  . buPROPion (WELLBUTRIN SR) 100 MG 12 hr tablet bupropion HCl SR 100 mg tablet,12 hr sustained-release    . cetirizine (ZYRTEC) 10 MG tablet Take 10 mg by mouth daily.     . Cholecalciferol (VITAMIN D) 2000 UNITS CAPS Take 1 capsule by mouth daily.     . DULoxetine (CYMBALTA) 60 MG capsule Take 60 mg by mouth every morning.    Marland Kitchen estradiol (ESTRACE) 0.1 MG/GM vaginal cream INSERT 0.5 GRAM VAGINALLY ONCE DAILY PRN. USES OCCASIONALLY  1  . lamoTRIgine (LAMICTAL) 200 MG tablet TK 1 T PO D  1  . lisinopril (PRINIVIL,ZESTRIL) 10 MG tablet TAKE 1 TABLET(10 MG) BY MOUTH DAILY 30 tablet 0  . OMEGA-3 FATTY ACIDS-VITAMIN E PO Take 1,000 mg by mouth daily.     . valACYclovir (VALTREX) 1000 MG tablet Take 1,000 mg by mouth daily.    Marland Kitchen acetaminophen (TYLENOL) 500 MG tablet Take by mouth. (Patient not taking: Reported on 08/06/2020)    . traZODone (DESYREL) 50 MG tablet trazodone 50 mg tablet (Patient not taking: Reported on 08/06/2020)     No current facility-administered medications for this visit.   ROS General: negative for fevers, changes in weight or night sweats Skin: negative for changes in moles or sores or rash Eyes: negative for changes in vision HEENT: negative for change in hearing, tinnitus, voice changes Pulmonary: negative for dyspnea, orthopnea, productive cough, wheezing Cardiac: negative for palpitations, pain Gastrointestinal: occasional constipation o/w negative for nausea, vomiting, diarrhea, hematemesis, hematochezia Genitourinary/Sexual: negative for dysuria, retention, hematuria, incontinence Ob/Gyn:  negative for abnormal bleeding, or pain Musculoskeletal: negative for pain, joint pain, back pain Hematology: negative for easy bruising, abnormal bleeding Neurologic/Psych: negative for headaches, seizures, paralysis, weakness, numbness   Objective:  Physical Examination:  98.4 127/64   W162.8 H5.7     ECOG Performance Status: 0 - Asymptomatic  GENERAL: Patient is a well appearing female in no acute distress HEENT:  PERRL, neck supple with midline trachea. Thyroid without masses.  NODES:  No cervical, supraclavicular, axillary, or inguinal lymphadenopathy palpated.  LUNGS:  Clear to auscultation bilaterally.   HEART:  Regular rate and rhythm.  ABDOMEN:  Soft, nontender. No masses or ascites.  MSK:  No focal spinal tenderness to palpation. Full range of motion bilaterally in the upper extremities. EXTREMITIES:  No peripheral edema.   SKIN:  Clear with no obvious rashes or skin changes. No nail dyscrasia. NEURO:  Nonfocal. Well oriented.  Appropriate affect.  Pelvic: Chaperoned by nursing EGBUS: pigmented lesion posterior fourchette 7 o'clock. The biopsy sites have healed. The lesion may also extend on the left toward 4 o'clock. Pigmented lesion at 8-9 o'clock.  Cervix: no lesions, nontender, mobile Vagina: no lesions, no  discharge or bleeding Uterus: normal size, nontender, mobile Adnexa: no palpable masses Rectovaginal: confirmatory     Lab Review No labs on site today:   Radiologic Imaging: No imaging on site today    Assessment:  Denise Rasmussen is a 64 y.o. 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   Medical co-morbidities complicating care: HTN and borderline diabetes.   Plan:   Problem List Items Addressed This Visit      Genitourinary   Vulvar dysplasia - Primary      We discussed options for management including surgery versus topical therapy with imiquimod. Given risk of occult malignancy recommend surgery with colposcopy of the vulvar, directed biopsies, and WLE. She agrees with this plan.   Risks were discussed in detail. These include infection, anesthesia, bleeding, transfusion, wound separation,  medical issues (blood clots, stroke, heart attack, fluid in the lungs, pneumonia, abnormal heart rhythm, death), possible exploratory surgery with larger incision,  allergic reaction, injury to adjacent organs (bowel, bladder, blood vessels, nerves). Need for additional surgery.   Suggested return to clinic in  3-4 weeks after surgery.    Discontinue Omega supplements prior to surgery.   Gyn VTE Prophylaxis Algorithm  Risk factors for VTE (calculator):  Point value Risk factors  1 point each none in this category   2 points each age 61-74  3 points each none in this category  5 points each  none in this category   Minor surgery (< 30 min).  Risk assessment: 0-2 total points; Low risk - SCDs   The patient's diagnosis, an outline of the further diagnostic and laboratory studies which will be required, the recommendation for surgery, and alternatives were discussed with her and her accompanying family members.  All questions were answered to their satisfaction.  A total of 60 minutes were spent with the patient/family today; >50% was spent in education, counseling and coordination of care for vulvar dysplasia.   I personally had a face to face interaction and evaluated the patient jointly with the NP, Ms. Lauren Allen.  I have reviewed her history and available records and have performed the key portions of the physical exam including lymph node survey, abdominal exam, pelvic exam with my findings confirming those documented above by the APP.  I have discussed the case with the APP and the patient.  I agree with the above documentation, assessment and plan which was fully formulated by me.  Counseling was completed by me.   I personally saw the patient and performed a substantive portion of this encounter in conjunction with the listed APP as documented above.  Tamarius Rosenfield Alvarez Twanda Stakes, MD    CC:  Bethany Beasley, MD [address] [place],   [phone]  

## 2020-08-06 NOTE — Patient Instructions (Signed)
In Preparation of your upcoming procedure you are having testing for Covid-19 on Monday, 08/25/20 at the [facility]. This is a drive-thru testing site. Please come between 8:00am-1:00pm.  We are asking that you stay at home and avoid visitors from this point until after your procedure is completed to minimize potential for Covid-19 exposure.    Please Wash your hands frequently with soap and water or clean hands with an alcohol-based hand sanitizer often.  Do not touch your eyes, nose, and mouth, especially with unwashed hands.  Should you at any time develop new symptoms of fever, cough, sneezing or runny nose, sore throat, difficulty breathing, or unexplained body aches, we ask that you contact your provider.   It may take up to 48 hours for results of this testing to be made available.   You will not receive notification if test results are negative.  If test results are positive for Covid-19, your provider or his/her representative will notify you by phone, with additional instructions.     Vulvectomy, Wide Local Excision, or Laser Ablation: Surgical Information  The vulva is the external female genitalia, outside and around the vagina and pubic bone. It consists of:  The skin on, and in front of, the pubic bone.  The clitoris.  The labia majora (large lips) on the outside of the vagina.  The labia minora (small lips) around the opening of the vagina.  The opening and the skin in and around the vagina.   A vulvectomy or wide local excision of the vulva is the removal of the tissue of the vulva, which sometimes includes removal of the lymph nodes and tissue in the groin areas.  After surgery, a doctor will look at the tissue under a microscope. He or she will check it for abnormal cells.    Laser ablation is a procedure used to treat precancerous skin changes. The top layers of skin are removed with the laser. The wound is then covered with ointment. A scab will form over the  area. The wound may take 3 to 6 week s to heal, depending on the size of the area treated. Good wound care may help the scar fade with time.  Most women go home 1 to 4 hours after surgery. You may need to stay overnight if you had lymph nodes removed. You will probably be able to return to your normal routine in 1 or 2 weeks.    Incision Care after surgery Do not douche or use tampons  Do not have sexual intercourse until your caregiver gives you permission.  After moving your bowels or urinating use a bottle filled with warm water (peri-bottle) to clean the area. Gently wipe from front to back or pat the skin dry. You may use a blow dryer on the cool setting to help dry the skin.  Wear loose cotton underwear. Do not wear tight pants.  A sitz bath will help keep your perineal area clean, reduce swelling, and provide comfort.  You may notice that your urine stream is at a different angle, and may spray. Using a plastic funnel may help decrease spray  Avoid exercises, such as running or biking for 3 weeks or as directed.  If your doctor has removed lymph nodes from your groin area, there may be an increase in swelling of your legs and feet. You can help prevent swelling by doing the following:    Elevate your legs while sitting or lying down.    Avoid standing  in one place for long periods of time.    Avoid salt in your diet. It can cause fluid retention and swelling.    Do not cross your legs, especially when sitting.   Follow-up care is a key part of your treatment and safety. Be sure to make and go to all appointments, and call your doctor if you are having problems. It's also a good idea to know your test results and keep a list of the me dicines you take.     Care After Vulvar Laser   HOME CARE INSTRUCTIONS  . Do not rub the area after urinating. Gently pat the area dry or use a bottle filled with warm water (peri-bottle) to clean the area. Gently wipe from front to back.  . Clean the area  using water and mild soap. Gently pat the area dry.  . Do not have intercourse for 4 weeks.  . Avoid exercise, such as running or biking, for 3 weeks.  . You may shower after the procedure.  . Wear loose cotton underwear. Do not wear tight pants.  Marland Kitchen Keep all follow-up appointments with your caregiver.  Benay Pillow your caregiver to obtain your test results. SEEK IMMEDIATE MEDICAL CARE IF:  . Your pain increases and medicine does not help it.  . Your vaginal area becomes swollen or red.  . You have fluid or an abnormally bad smell coming from your vulva.  . You have a fever. MAKE SURE YOU:  . Understand these instructions.  . Will watch your condition.  . Will get help right away if you are not doing well or get worse.

## 2020-08-06 NOTE — Progress Notes (Signed)
Gynecologic Oncology Consult Visit   Referring Provider: Dr. 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 G2P2 female who is seen in consultation from Dr. 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   07/01/2020 vulvar nodules-3 small white verrucal lesions of the posterior fourchette -biopsies performed x 3.  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  Uses pessary for symptoms.  She is postmenopausal at age 30.  Denies history of abnormal Pap smears. G2P2    Problem List: Patient Active Problem List   Diagnosis Date Noted  . 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 Pure hypercholesterolemia 10/05/2017  . Vaccine counseling 10/05/2017  . Essential hypertension 02/04/2017  . Moderate episode of recurrent major depressive disorder (HCC) 02/04/2017  . Primary osteoarthritis of both hands 02/04/2017  . Ganglion cyst 07/01/2016  . Hand pain 06/30/2016  . HSV infection 05/03/2015  . Depression, major, single episode, mild (HCC) 05/03/2015  . Arthritis, degenerative 05/03/2015  . Post menopausal syndrome 05/03/2015  . Borderline diabetes mellitus 05/03/2015  . Raynaud's syndrome 05/03/2015  . Vitamin D deficiency 05/03/2015  . Cervical pain 07/26/2008  . Insomnia 01/24/2008    Past Medical History: Past Medical History:  Diagnosis Date  . Allergy   . Arthritis   . Breast mass 10/2016   right  . Hypertension     Past Surgical History: History reviewed. No pertinent surgical history.  Past Gynecologic History:  Menarche: 14 History of Abnormal pap: No Last pap: as per HPI Sexually active: yes  OB History:  OB History  No obstetric history on file.    Family History: Family History  Problem Relation Age of Onset  . Breast cancer Maternal Grandmother   . Hypertension Mother    . Diabetes Mother   . Heart disease Mother     Social History: Social History   Socioeconomic History  . Marital status: Widowed    Spouse name: Not on file  . Number of children: Not on file  . Years of education: Not on file  . Highest education level: Not on file  Occupational History  . Not on file  Tobacco Use  . Smoking status: Never Smoker  . Smokeless tobacco: Never Used  Vaping Use  . Vaping Use: Never used  Substance and Sexual Activity  . Alcohol use: Yes    Alcohol/week: 0.0 standard drinks    Comment: occasionally  . Drug use: No  . Sexual activity: Yes  Other Topics Concern  . Not on file  Social History Narrative  . Not on file   Social Determinants of Health   Financial Resource Strain:   . Difficulty of Paying Living Expenses: Not on file  Food Insecurity:   . Worried About Programme researcher, broadcasting/film/video in the Last Year: Not on file  . Ran Out of Food in the Last Year: Not on file  Transportation Needs:   . Lack of Transportation (Medical): Not on file  . Lack of Transportation (Non-Medical): Not on file  Physical Activity:   . Days of Exercise per Week: Not on file  . Minutes of Exercise per Session: Not on file  Stress:   . Feeling of Stress : Not on file  Social Connections:   . Frequency of Communication with Friends and Family: Not on file  .  Frequency of Social Gatherings with Friends and Family: Not on file  . Attends Religious Services: Not on file  . Active Member of Clubs or Organizations: Not on file  . Attends Banker Meetings: Not on file  . Marital Status: Not on file  Intimate Partner Violence:   . Fear of Current or Ex-Partner: Not on file  . Emotionally Abused: Not on file  . Physically Abused: Not on file  . Sexually Abused: Not on file    Allergies: Allergies  Allergen Reactions  . Other     NKDA per patient    Current Medications: Current Outpatient Medications  Medication Sig Dispense Refill  . ALPRAZolam  (XANAX) 0.5 MG tablet TAKE 1 TABLET BY MOUTH AS NEEDED FOR ANXIETY 30 tablet 0  . buPROPion (WELLBUTRIN SR) 100 MG 12 hr tablet bupropion HCl SR 100 mg tablet,12 hr sustained-release    . cetirizine (ZYRTEC) 10 MG tablet Take 10 mg by mouth daily.     . Cholecalciferol (VITAMIN D) 2000 UNITS CAPS Take 1 capsule by mouth daily.     . DULoxetine (CYMBALTA) 60 MG capsule Take 60 mg by mouth every morning.    Marland Kitchen estradiol (ESTRACE) 0.1 MG/GM vaginal cream INSERT 0.5 GRAM VAGINALLY ONCE DAILY PRN. USES OCCASIONALLY  1  . lamoTRIgine (LAMICTAL) 200 MG tablet TK 1 T PO D  1  . lisinopril (PRINIVIL,ZESTRIL) 10 MG tablet TAKE 1 TABLET(10 MG) BY MOUTH DAILY 30 tablet 0  . OMEGA-3 FATTY ACIDS-VITAMIN E PO Take 1,000 mg by mouth daily.     . valACYclovir (VALTREX) 1000 MG tablet Take 1,000 mg by mouth daily.    Marland Kitchen acetaminophen (TYLENOL) 500 MG tablet Take by mouth. (Patient not taking: Reported on 08/06/2020)    . traZODone (DESYREL) 50 MG tablet trazodone 50 mg tablet (Patient not taking: Reported on 08/06/2020)     No current facility-administered medications for this visit.   ROS General: negative for fevers, changes in weight or night sweats Skin: negative for changes in moles or sores or rash Eyes: negative for changes in vision HEENT: negative for change in hearing, tinnitus, voice changes Pulmonary: negative for dyspnea, orthopnea, productive cough, wheezing Cardiac: negative for palpitations, pain Gastrointestinal: occasional constipation o/w negative for nausea, vomiting, diarrhea, hematemesis, hematochezia Genitourinary/Sexual: negative for dysuria, retention, hematuria, incontinence Ob/Gyn:  negative for abnormal bleeding, or pain Musculoskeletal: negative for pain, joint pain, back pain Hematology: negative for easy bruising, abnormal bleeding Neurologic/Psych: negative for headaches, seizures, paralysis, weakness, numbness   Objective:  Physical Examination:  98.4 127/64   W162.8 H5.7     ECOG Performance Status: 0 - Asymptomatic  GENERAL: Patient is a well appearing female in no acute distress HEENT:  PERRL, neck supple with midline trachea. Thyroid without masses.  NODES:  No cervical, supraclavicular, axillary, or inguinal lymphadenopathy palpated.  LUNGS:  Clear to auscultation bilaterally.   HEART:  Regular rate and rhythm.  ABDOMEN:  Soft, nontender. No masses or ascites.  MSK:  No focal spinal tenderness to palpation. Full range of motion bilaterally in the upper extremities. EXTREMITIES:  No peripheral edema.   SKIN:  Clear with no obvious rashes or skin changes. No nail dyscrasia. NEURO:  Nonfocal. Well oriented.  Appropriate affect.  Pelvic: Chaperoned by nursing EGBUS: pigmented lesion posterior fourchette 7 o'clock. The biopsy sites have healed. The lesion may also extend on the left toward 4 o'clock. Pigmented lesion at 8-9 o'clock.  Cervix: no lesions, nontender, mobile Vagina: no lesions, no  discharge or bleeding Uterus: normal size, nontender, mobile Adnexa: no palpable masses Rectovaginal: confirmatory     Lab Review No labs on site today:   Radiologic Imaging: No imaging on site today    Assessment:  Denise Rasmussen is a 64 y.o. 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   Medical co-morbidities complicating care: HTN and borderline diabetes.   Plan:   Problem List Items Addressed This Visit      Genitourinary   Vulvar dysplasia - Primary      We discussed options for management including surgery versus topical therapy with imiquimod. Given risk of occult malignancy recommend surgery with colposcopy of the vulvar, directed biopsies, and WLE. She agrees with this plan.   Risks were discussed in detail. These include infection, anesthesia, bleeding, transfusion, wound separation,  medical issues (blood clots, stroke, heart attack, fluid in the lungs, pneumonia, abnormal heart rhythm, death), possible exploratory surgery with larger incision,  allergic reaction, injury to adjacent organs (bowel, bladder, blood vessels, nerves). Need for additional surgery.   Suggested return to clinic in  3-4 weeks after surgery.    Discontinue Omega supplements prior to surgery.   Gyn VTE Prophylaxis Algorithm  Risk factors for VTE (calculator):  Point value Risk factors  1 point each none in this category   2 points each age 43-74  3 points each none in this category  5 points each  none in this category   Minor surgery (< 30 min).  Risk assessment: 0-2 total points; Low risk - SCDs   The patient's diagnosis, an outline of the further diagnostic and laboratory studies which will be required, the recommendation for surgery, and alternatives were discussed with her and her accompanying family members.  All questions were answered to their satisfaction.  A total of 60 minutes were spent with the patient/family today; >50% was spent in education, counseling and coordination of care for vulvar dysplasia.   I personally had a face to face interaction and evaluated the patient jointly with the NP, Ms. Consuello Masse.  I have reviewed her history and available records and have performed the key portions of the physical exam including lymph node survey, abdominal exam, pelvic exam with my findings confirming those documented above by the APP.  I have discussed the case with the APP and the patient.  I agree with the above documentation, assessment and plan which was fully formulated by me.  Counseling was completed by me.   I personally saw the patient and performed a substantive portion of this encounter in conjunction with the listed APP as documented above.  Denise Rasmussen Leta Jungling, MD    CC:  Christeen Douglas, MD [address] [place],  [place] [address] [phone]

## 2020-08-06 NOTE — Progress Notes (Signed)
Pt has no drainage, bleeding, irritation or dryness from GYN point of view.

## 2020-08-06 NOTE — H&P (View-Only) (Signed)
Gynecologic Oncology Consult Visit   Referring Provider: Dr. 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   07/01/2020 vulvar nodules-3 small white verrucal lesions of the posterior fourchette -biopsies performed x 3.  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  Uses pessary for symptoms.  She is postmenopausal at age 52.  Denies history of abnormal Pap smears. G2P2    Problem List: Patient Active Problem List   Diagnosis Date Noted  . 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 Pure hypercholesterolemia 10/05/2017  . Vaccine counseling 10/05/2017  . Essential hypertension 02/04/2017  . Moderate episode of recurrent major depressive disorder (HCC) 02/04/2017  . Primary osteoarthritis of both hands 02/04/2017  . Ganglion cyst 07/01/2016  . Hand pain 06/30/2016  . HSV infection 05/03/2015  . Depression, major, single episode, mild (HCC) 05/03/2015  . Arthritis, degenerative 05/03/2015  . Post menopausal syndrome 05/03/2015  . Borderline diabetes mellitus 05/03/2015  . Raynaud's syndrome 05/03/2015  . Vitamin D deficiency 05/03/2015  . Cervical pain 07/26/2008  . Insomnia 01/24/2008    Past Medical History: Past Medical History:  Diagnosis Date  . Allergy   . Arthritis   . Breast mass 10/2016   right  . Hypertension     Past Surgical History: History reviewed. No pertinent surgical history.  Past Gynecologic History:  Menarche: 14 History of Abnormal pap: No Last pap: as per HPI Sexually active: yes  OB History:  OB History  No obstetric history on file.    Family History: Family History  Problem Relation Age of Onset  . Breast cancer Maternal Grandmother   . Hypertension Mother    . Diabetes Mother   . Heart disease Mother     Social History: Social History   Socioeconomic History  . Marital status: Widowed    Spouse name: Not on file  . Number of children: Not on file  . Years of education: Not on file  . Highest education level: Not on file  Occupational History  . Not on file  Tobacco Use  . Smoking status: Never Smoker  . Smokeless tobacco: Never Used  Vaping Use  . Vaping Use: Never used  Substance and Sexual Activity  . Alcohol use: Yes    Alcohol/week: 0.0 standard drinks    Comment: occasionally  . Drug use: No  . Sexual activity: Yes  Other Topics Concern  . Not on file  Social History Narrative  . Not on file   Social Determinants of Health   Financial Resource Strain:   . Difficulty of Paying Living Expenses: Not on file  Food Insecurity:   . Worried About Running Out of Food in the Last Year: Not on file  . Ran Out of Food in the Last Year: Not on file  Transportation Needs:   . Lack of Transportation (Medical): Not on file  . Lack of Transportation (Non-Medical): Not on file  Physical Activity:   . Days of Exercise per Week: Not on file  . Minutes of Exercise per Session: Not on file  Stress:   . Feeling of Stress : Not on file  Social Connections:   . Frequency of Communication with Friends and Family: Not on file  .   Frequency of Social Gatherings with Friends and Family: Not on file  . Attends Religious Services: Not on file  . Active Member of Clubs or Organizations: Not on file  . Attends Banker Meetings: Not on file  . Marital Status: Not on file  Intimate Partner Violence:   . Fear of Current or Ex-Partner: Not on file  . Emotionally Abused: Not on file  . Physically Abused: Not on file  . Sexually Abused: Not on file    Allergies: Allergies  Allergen Reactions  . Other     NKDA per patient    Current Medications: Current Outpatient Medications  Medication Sig Dispense Refill  . ALPRAZolam  (XANAX) 0.5 MG tablet TAKE 1 TABLET BY MOUTH AS NEEDED FOR ANXIETY 30 tablet 0  . buPROPion (WELLBUTRIN SR) 100 MG 12 hr tablet bupropion HCl SR 100 mg tablet,12 hr sustained-release    . cetirizine (ZYRTEC) 10 MG tablet Take 10 mg by mouth daily.     . Cholecalciferol (VITAMIN D) 2000 UNITS CAPS Take 1 capsule by mouth daily.     . DULoxetine (CYMBALTA) 60 MG capsule Take 60 mg by mouth every morning.    Marland Kitchen estradiol (ESTRACE) 0.1 MG/GM vaginal cream INSERT 0.5 GRAM VAGINALLY ONCE DAILY PRN. USES OCCASIONALLY  1  . lamoTRIgine (LAMICTAL) 200 MG tablet TK 1 T PO D  1  . lisinopril (PRINIVIL,ZESTRIL) 10 MG tablet TAKE 1 TABLET(10 MG) BY MOUTH DAILY 30 tablet 0  . OMEGA-3 FATTY ACIDS-VITAMIN E PO Take 1,000 mg by mouth daily.     . valACYclovir (VALTREX) 1000 MG tablet Take 1,000 mg by mouth daily.    Marland Kitchen acetaminophen (TYLENOL) 500 MG tablet Take by mouth. (Patient not taking: Reported on 08/06/2020)    . traZODone (DESYREL) 50 MG tablet trazodone 50 mg tablet (Patient not taking: Reported on 08/06/2020)     No current facility-administered medications for this visit.   ROS General: negative for fevers, changes in weight or night sweats Skin: negative for changes in moles or sores or rash Eyes: negative for changes in vision HEENT: negative for change in hearing, tinnitus, voice changes Pulmonary: negative for dyspnea, orthopnea, productive cough, wheezing Cardiac: negative for palpitations, pain Gastrointestinal: occasional constipation o/w negative for nausea, vomiting, diarrhea, hematemesis, hematochezia Genitourinary/Sexual: negative for dysuria, retention, hematuria, incontinence Ob/Gyn:  negative for abnormal bleeding, or pain Musculoskeletal: negative for pain, joint pain, back pain Hematology: negative for easy bruising, abnormal bleeding Neurologic/Psych: negative for headaches, seizures, paralysis, weakness, numbness   Objective:  Physical Examination:  98.4 127/64   W162.8 H5.7     ECOG Performance Status: 0 - Asymptomatic  GENERAL: Patient is a well appearing female in no acute distress HEENT:  PERRL, neck supple with midline trachea. Thyroid without masses.  NODES:  No cervical, supraclavicular, axillary, or inguinal lymphadenopathy palpated.  LUNGS:  Clear to auscultation bilaterally.   HEART:  Regular rate and rhythm.  ABDOMEN:  Soft, nontender. No masses or ascites.  MSK:  No focal spinal tenderness to palpation. Full range of motion bilaterally in the upper extremities. EXTREMITIES:  No peripheral edema.   SKIN:  Clear with no obvious rashes or skin changes. No nail dyscrasia. NEURO:  Nonfocal. Well oriented.  Appropriate affect.  Pelvic: Chaperoned by nursing EGBUS: pigmented lesion posterior fourchette 7 o'clock. The biopsy sites have healed. The lesion may also extend on the left toward 4 o'clock. Pigmented lesion at 8-9 o'clock.  Cervix: no lesions, nontender, mobile Vagina: no lesions, no  discharge or bleeding Uterus: normal size, nontender, mobile Adnexa: no palpable masses Rectovaginal: confirmatory     Lab Review No labs on site today:   Radiologic Imaging: No imaging on site today    Assessment:  Denise Rasmussen is a 64 y.o. 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   Medical co-morbidities complicating care: HTN and borderline diabetes.   Plan:   Problem List Items Addressed This Visit      Genitourinary   Vulvar dysplasia - Primary      We discussed options for management including surgery versus topical therapy with imiquimod. Given risk of occult malignancy recommend surgery with colposcopy of the vulvar, directed biopsies, and WLE. She agrees with this plan.   Risks were discussed in detail. These include infection, anesthesia, bleeding, transfusion, wound separation,  medical issues (blood clots, stroke, heart attack, fluid in the lungs, pneumonia, abnormal heart rhythm, death), possible exploratory surgery with larger incision,  allergic reaction, injury to adjacent organs (bowel, bladder, blood vessels, nerves). Need for additional surgery.   Suggested return to clinic in  3-4 weeks after surgery.    Discontinue Omega supplements prior to surgery.   Gyn VTE Prophylaxis Algorithm  Risk factors for VTE (calculator):  Point value Risk factors  1 point each none in this category   2 points each age 61-74  3 points each none in this category  5 points each  none in this category   Minor surgery (< 30 min).  Risk assessment: 0-2 total points; Low risk - SCDs   The patient's diagnosis, an outline of the further diagnostic and laboratory studies which will be required, the recommendation for surgery, and alternatives were discussed with her and her accompanying family members.  All questions were answered to their satisfaction.  A total of 60 minutes were spent with the patient/family today; >50% was spent in education, counseling and coordination of care for vulvar dysplasia.   I personally had a face to face interaction and evaluated the patient jointly with the NP, Ms. Lauren Allen.  I have reviewed her history and available records and have performed the key portions of the physical exam including lymph node survey, abdominal exam, pelvic exam with my findings confirming those documented above by the APP.  I have discussed the case with the APP and the patient.  I agree with the above documentation, assessment and plan which was fully formulated by me.  Counseling was completed by me.   I personally saw the patient and performed a substantive portion of this encounter in conjunction with the listed APP as documented above.  Danaiya Steadman Alvarez Patsey Pitstick, MD    CC:  Bethany Beasley, MD [address] [place],  [place] [address] [phone]  

## 2020-08-07 ENCOUNTER — Telehealth: Payer: Self-pay

## 2020-08-07 NOTE — Telephone Encounter (Signed)
Voicemail left with Ms. Izola Price to return call for PAT appointment details.

## 2020-08-18 ENCOUNTER — Encounter
Admission: RE | Admit: 2020-08-18 | Discharge: 2020-08-18 | Disposition: A | Discharge status: Home / Self Care | Payer: BC Managed Care – PPO | Source: Ambulatory Visit | Attending: Obstetrics and Gynecology | Admitting: Obstetrics and Gynecology

## 2020-08-18 ENCOUNTER — Other Ambulatory Visit: Payer: Self-pay

## 2020-08-18 HISTORY — DX: Prediabetes: R73.03

## 2020-08-18 HISTORY — DX: Anxiety disorder, unspecified: F41.9

## 2020-08-18 HISTORY — DX: Depression, unspecified: F32.A

## 2020-08-18 NOTE — Patient Instructions (Addendum)
Your procedure is scheduled on: Wed. 11/3 Report to Day Surgery. To find out your arrival time please call [phone] between 1PM - 3PM on Tues 11/2.  Remember: Instructions that are not followed completely may result in serious medical risk,  up to and including death, or upon the discretion of your surgeon and anesthesiologist your  surgery may need to be rescheduled.     _X__ 1. Do not eat food after midnight the night before your procedure.                 No chewing gum or hard candies. You may drink clear liquids up to 2 hours                 before you are scheduled to arrive for your surgery- DO not drink clear                 liquids within 2 hours of the start of your surgery.                 Clear Liquids include:  water, apple juice without pulp, clear Gatorade, G2 or                  Gatorade Zero (avoid Red/Purple/Blue), Black Coffee or Tea (Do not add                 anything to coffee or tea). _x____2.   Complete the G2 drink provided 2 hours before arrival.  __X__2.  On the morning of surgery brush your teeth with toothpaste and water, you                may rinse your mouth with mouthwash if you wish.  Do not swallow any toothpaste of mouthwash.     _X__ 3.  No Alcohol for 24 hours before or after surgery.   ___ 4.  Do Not Smoke or use e-cigarettes For 24 Hours Prior to Your Surgery.                 Do not use any chewable tobacco products for at least 6 hours prior to                 Surgery.  ___  5.  Do not use any recreational drugs (marijuana, cocaine, heroin, ecstasy, MDMA or other)                For at least one week prior to your surgery.  Combination of these drugs with anesthesia                May have life threatening results.  ____  6.  Bring all medications with you on the day of surgery if instructed.   __x__  7.  Notify your doctor if there is any change in your medical condition      (cold, fever, infections).     Do  not wear jewelry, make-up, hairpins, clips or nail polish. Do not wear lotions, powders, or perfumes. You may wear deodorant. Do not shave 48 hours prior to surgery. Do not bring valuables to the hospital.    [facility] is not responsible for any belongings or valuables.  Contacts, dentures or bridgework may not be worn into surgery. Leave your suitcase in the car. After surgery it may be brought to your room. For patients admitted to the hospital, discharge time is determined by your treatment team.   Patients discharged the day of  surgery will not be allowed to drive home.   Make arrangements for someone to be with you for the first 24 hours of your Same Day Discharge.    Please read over the following fact sheets that you were given:     _x___ Take these medicines the morning of surgery with A SIP OF WATER:    1. acetaminophen (TYLENOL) 500 MG tablet if needed  2. ALPRAZolam (XANAX) 0.5 MG tablet if needed  3. buPROPion (WELLBUTRIN SR) 100 MG 12 hr tablet  4.DULoxetine (CYMBALTA) 60 MG capsule  5.lamoTRIgine (LAMICTAL) 200 MG tablet  6.valACYclovir (VALTREX) 1000 MG tablet  ____ Fleet Enema (as directed)   ____ Use CHG Soap (or wipes) as directed  ____ Use Benzoyl Peroxide Gel as instructed  ____ Use inhalers on the day of surgery  ____ Stop metformin 2 days prior to surgery    ____ Take 1/2 of usual insulin dose the night before surgery. No insulin the morning          of surgery.   ____ Stop Coumadin/Plavix/aspirin on   __x__ Stop Anti-inflammatories  No ibuprofen aleve or aspirin products after 10/27     May take tylenol   __x__ Stop supplements   Omega-3 Fatty Acids (FISH OIL) 1000 MG CAPS on 10/27  ____ Bring C-Pap to the hospital.    If you have any questions regarding your pre-procedure instructions,  Please call Pre-admit Testing at [phone] [employer] pack: one part rubbing alcohol 2 parts water in a ziplock (double bag) freeze and wrap in  cloth and apply to area for pain and swelling. Stool softener 2 times a day after surgery.

## 2020-08-19 ENCOUNTER — Encounter
Admission: RE | Admit: 2020-08-19 | Discharge: 2020-08-19 | Disposition: A | Discharge status: Home / Self Care | Payer: BC Managed Care – PPO | Source: Ambulatory Visit | Attending: Obstetrics and Gynecology | Admitting: Obstetrics and Gynecology

## 2020-08-19 DIAGNOSIS — Z01818 Encounter for other preprocedural examination: Secondary | ICD-10-CM | POA: Diagnosis not present

## 2020-08-19 DIAGNOSIS — I10 Essential (primary) hypertension: Secondary | ICD-10-CM | POA: Insufficient documentation

## 2020-08-25 ENCOUNTER — Other Ambulatory Visit: Payer: Self-pay

## 2020-08-25 ENCOUNTER — Other Ambulatory Visit
Admission: RE | Admit: 2020-08-25 | Discharge: 2020-08-25 | Disposition: A | Discharge status: Home / Self Care | Payer: BC Managed Care – PPO | Source: Ambulatory Visit | Attending: Obstetrics and Gynecology | Admitting: Obstetrics and Gynecology

## 2020-08-25 DIAGNOSIS — Z20822 Contact with and (suspected) exposure to covid-19: Secondary | ICD-10-CM | POA: Insufficient documentation

## 2020-08-25 DIAGNOSIS — Z01812 Encounter for preprocedural laboratory examination: Secondary | ICD-10-CM | POA: Diagnosis not present

## 2020-08-26 LAB — TYPE AND SCREEN
ABO/RH(D): O POS
Antibody Screen: NEGATIVE

## 2020-08-26 LAB — SARS CORONAVIRUS 2 (TAT 6-24 HRS): SARS Coronavirus 2: NEGATIVE

## 2020-08-26 MED ORDER — LACTATED RINGERS IV SOLN: INTRAVENOUS | Status: DC

## 2020-08-26 MED ORDER — POVIDONE-IODINE 10 % EX SWAB: 2.0000 "application " | Freq: Once | CUTANEOUS | Status: DC

## 2020-08-26 MED ORDER — FAMOTIDINE 20 MG PO TABS: 20.0000 mg | ORAL_TABLET | Freq: Once | ORAL | Status: DC

## 2020-08-26 MED ORDER — ORAL CARE MOUTH RINSE: 15.0000 mL | Freq: Once | OROMUCOSAL | Status: AC

## 2020-08-26 MED ORDER — CHLORHEXIDINE GLUCONATE 0.12 % MT SOLN
15.0000 mL | Freq: Once | OROMUCOSAL | Status: AC
  Administered 2020-08-27: 15 mL via OROMUCOSAL

## 2020-08-26 MED ORDER — CEFAZOLIN SODIUM-DEXTROSE 2-4 GM/100ML-% IV SOLN
2.0000 g | INTRAVENOUS | Status: AC
  Administered 2020-08-27: 2 g via INTRAVENOUS

## 2020-08-27 ENCOUNTER — Ambulatory Visit: Payer: BC Managed Care – PPO | Admitting: Urgent Care

## 2020-08-27 ENCOUNTER — Encounter: Payer: Self-pay | Admitting: Obstetrics and Gynecology

## 2020-08-27 ENCOUNTER — Other Ambulatory Visit: Payer: Self-pay

## 2020-08-27 ENCOUNTER — Other Ambulatory Visit: Payer: Self-pay | Admitting: Nurse Practitioner

## 2020-08-27 ENCOUNTER — Ambulatory Visit
Admission: RE | Admit: 2020-08-27 | Discharge: 2020-08-27 | Disposition: A | Discharge status: Home / Self Care | Payer: BC Managed Care – PPO | Attending: Obstetrics and Gynecology | Admitting: Obstetrics and Gynecology

## 2020-08-27 ENCOUNTER — Encounter
Admission: RE | Disposition: A | Discharge status: Home / Self Care | Payer: Self-pay | Source: Home / Self Care | Attending: Obstetrics and Gynecology

## 2020-08-27 DIAGNOSIS — I10 Essential (primary) hypertension: Secondary | ICD-10-CM | POA: Insufficient documentation

## 2020-08-27 DIAGNOSIS — Z8249 Family history of ischemic heart disease and other diseases of the circulatory system: Secondary | ICD-10-CM | POA: Insufficient documentation

## 2020-08-27 DIAGNOSIS — Z833 Family history of diabetes mellitus: Secondary | ICD-10-CM | POA: Diagnosis not present

## 2020-08-27 DIAGNOSIS — D071 Carcinoma in situ of vulva: Secondary | ICD-10-CM | POA: Insufficient documentation

## 2020-08-27 DIAGNOSIS — Z79899 Other long term (current) drug therapy: Secondary | ICD-10-CM | POA: Diagnosis not present

## 2020-08-27 DIAGNOSIS — N903 Dysplasia of vulva, unspecified: Secondary | ICD-10-CM

## 2020-08-27 DIAGNOSIS — Z803 Family history of malignant neoplasm of breast: Secondary | ICD-10-CM | POA: Insufficient documentation

## 2020-08-27 HISTORY — PX: VULVECTOMY: SHX1086

## 2020-08-27 HISTORY — PX: COLPOSCOPY: SHX161

## 2020-08-27 LAB — ABO/RH: ABO/RH(D): O POS

## 2020-08-27 SURGERY — COLPOSCOPY
Anesthesia: General | Site: Perineum

## 2020-08-27 MED ORDER — PROPOFOL 10 MG/ML IV BOLUS
INTRAVENOUS | Status: DC | PRN
  Administered 2020-08-27: 200 mg via INTRAVENOUS

## 2020-08-27 MED ORDER — MIDAZOLAM HCL 2 MG/2ML IJ SOLN
INTRAMUSCULAR | Status: DC | PRN
  Administered 2020-08-27: 2 mg via INTRAVENOUS

## 2020-08-27 MED ORDER — GLYCOPYRROLATE 0.2 MG/ML IJ SOLN
INTRAMUSCULAR | Status: DC | PRN
  Administered 2020-08-27: .2 mg via INTRAVENOUS

## 2020-08-27 MED ORDER — PROPOFOL 10 MG/ML IV BOLUS
INTRAVENOUS | Status: AC
  Filled 2020-08-27: qty 20

## 2020-08-27 MED ORDER — MIDAZOLAM HCL 2 MG/2ML IJ SOLN
INTRAMUSCULAR | Status: AC
  Filled 2020-08-27: qty 2

## 2020-08-27 MED ORDER — LIDOCAINE HCL (CARDIAC) PF 100 MG/5ML IV SOSY
PREFILLED_SYRINGE | INTRAVENOUS | Status: DC | PRN
  Administered 2020-08-27: 100 mg via INTRAVENOUS

## 2020-08-27 MED ORDER — BUPIVACAINE LIPOSOME 1.3 % IJ SUSP
INTRAMUSCULAR | Status: AC
  Filled 2020-08-27: qty 20

## 2020-08-27 MED ORDER — FENTANYL CITRATE (PF) 100 MCG/2ML IJ SOLN
INTRAMUSCULAR | Status: AC
  Filled 2020-08-27: qty 2

## 2020-08-27 MED ORDER — FENTANYL CITRATE (PF) 100 MCG/2ML IJ SOLN
INTRAMUSCULAR | Status: DC | PRN
  Administered 2020-08-27: 50 ug via INTRAVENOUS
  Administered 2020-08-27 (×2): 25 ug via INTRAVENOUS

## 2020-08-27 MED ORDER — BUPIVACAINE LIPOSOME 1.3 % IJ SUSP
INTRAMUSCULAR | Status: DC | PRN
  Administered 2020-08-27: 16 mL

## 2020-08-27 MED ORDER — OXYCODONE HCL 5 MG PO TABS
5.0000 mg | ORAL_TABLET | ORAL | 0 refills | Status: DC | PRN
Start: 2020-08-27 — End: 2020-11-05

## 2020-08-27 MED ORDER — SODIUM CHLORIDE 0.9 % IR SOLN
Status: DC | PRN
  Administered 2020-08-27: 500 mL

## 2020-08-27 MED ORDER — PROMETHAZINE HCL 25 MG/ML IJ SOLN: 6.2500 mg | INTRAMUSCULAR | Status: DC | PRN

## 2020-08-27 MED ORDER — FAMOTIDINE 20 MG PO TABS
ORAL_TABLET | ORAL | Status: AC
  Administered 2020-08-27: 20 mg
  Filled 2020-08-27: qty 1

## 2020-08-27 MED ORDER — OXYCODONE HCL 5 MG PO TABS: 5.0000 mg | ORAL_TABLET | Freq: Once | ORAL | Status: DC | PRN

## 2020-08-27 MED ORDER — MEPERIDINE HCL 50 MG/ML IJ SOLN: 6.2500 mg | INTRAMUSCULAR | Status: DC | PRN

## 2020-08-27 MED ORDER — ACETIC ACID 4% SOLUTION
Status: DC | PRN
  Administered 2020-08-27: 1 via TOPICAL

## 2020-08-27 MED ORDER — IODINE STRONG (LUGOLS) 5 % PO SOLN
ORAL | Status: AC
  Filled 2020-08-27: qty 1

## 2020-08-27 MED ORDER — ACETIC ACID 3 % SOLN
Status: AC
  Filled 2020-08-27: qty 500

## 2020-08-27 MED ORDER — CEFAZOLIN SODIUM-DEXTROSE 2-4 GM/100ML-% IV SOLN
INTRAVENOUS | Status: AC
  Filled 2020-08-27: qty 100

## 2020-08-27 MED ORDER — DEXAMETHASONE SODIUM PHOSPHATE 10 MG/ML IJ SOLN
INTRAMUSCULAR | Status: DC | PRN
  Administered 2020-08-27: 5 mg via INTRAVENOUS

## 2020-08-27 MED ORDER — ONDANSETRON HCL 4 MG/2ML IJ SOLN
INTRAMUSCULAR | Status: DC | PRN
  Administered 2020-08-27: 4 mg via INTRAVENOUS

## 2020-08-27 MED ORDER — OXYCODONE HCL 5 MG/5ML PO SOLN: 5.0000 mg | Freq: Once | ORAL | Status: DC | PRN

## 2020-08-27 MED ORDER — BUPIVACAINE HCL (PF) 0.25 % IJ SOLN
INTRAMUSCULAR | Status: DC | PRN
  Administered 2020-08-27: 4 mL

## 2020-08-27 MED ORDER — EPHEDRINE SULFATE 50 MG/ML IJ SOLN
INTRAMUSCULAR | Status: DC | PRN
  Administered 2020-08-27: 5 mg via INTRAVENOUS

## 2020-08-27 MED ORDER — BUPIVACAINE HCL (PF) 0.25 % IJ SOLN
INTRAMUSCULAR | Status: AC
  Filled 2020-08-27: qty 30

## 2020-08-27 MED ORDER — FERRIC SUBSULFATE 259 MG/GM EX SOLN
CUTANEOUS | Status: AC
  Filled 2020-08-27: qty 8

## 2020-08-27 MED ORDER — CHLORHEXIDINE GLUCONATE 0.12 % MT SOLN
OROMUCOSAL | Status: AC
  Filled 2020-08-27: qty 15

## 2020-08-27 MED ORDER — FENTANYL CITRATE (PF) 100 MCG/2ML IJ SOLN: 25.0000 ug | INTRAMUSCULAR | Status: DC | PRN

## 2020-08-27 SURGICAL SUPPLY — 42 items
BLADE SURG 15 STRL LF DISP TIS (BLADE) ×2 IMPLANT
BLADE SURG 15 STRL SS (BLADE) ×4
BLADE SURG SZ10 CARB STEEL (BLADE) IMPLANT
CANISTER SUCT 1200ML W/VALVE (MISCELLANEOUS) IMPLANT
CATH ROBINSON RED A/P 16FR (CATHETERS) ×4 IMPLANT
CNTNR SPEC 2.5X3XGRAD LEK (MISCELLANEOUS) ×2
CONT SPEC 4OZ STER OR WHT (MISCELLANEOUS) ×2
CONT SPEC 4OZ STRL OR WHT (MISCELLANEOUS) ×2
CONTAINER SPEC 2.5X3XGRAD LEK (MISCELLANEOUS) ×2 IMPLANT
COVER WAND RF STERILE (DRAPES) IMPLANT
DRAPE PERI LITHO V/GYN (MISCELLANEOUS) ×4 IMPLANT
DRAPE UNDER BUTTOCK W/FLU (DRAPES) ×4 IMPLANT
DRSG TELFA 3X8 NADH (GAUZE/BANDAGES/DRESSINGS) ×4 IMPLANT
ELECT CAUTERY BLADE 6.4 (BLADE) ×4 IMPLANT
ELECT CAUTERY NEEDLE TIP 1.0 (MISCELLANEOUS)
ELECT REM PT RETURN 9FT ADLT (ELECTROSURGICAL) ×4
ELECTRODE CAUTERY NEDL TIP 1.0 (MISCELLANEOUS) IMPLANT
ELECTRODE REM PT RTRN 9FT ADLT (ELECTROSURGICAL) ×2 IMPLANT
GAUZE 4X4 16PLY RFD (DISPOSABLE) ×4 IMPLANT
GLOVE BIO SURGEON STRL SZ8 (GLOVE) IMPLANT
GLOVE INDICATOR 8.0 STRL GRN (GLOVE) IMPLANT
GOWN STRL REUS W/ TWL LRG LVL3 (GOWN DISPOSABLE) ×2 IMPLANT
GOWN STRL REUS W/ TWL XL LVL3 (GOWN DISPOSABLE) ×2 IMPLANT
GOWN STRL REUS W/TWL LRG LVL3 (GOWN DISPOSABLE) ×4
GOWN STRL REUS W/TWL XL LVL3 (GOWN DISPOSABLE) ×4
LABEL OR SOLS (LABEL) ×4 IMPLANT
NEEDLE HYPO 22GX1.5 SAFETY (NEEDLE) ×8 IMPLANT
NS IRRIG 500ML POUR BTL (IV SOLUTION) ×4 IMPLANT
PACK BASIN MINOR (MISCELLANEOUS) ×4 IMPLANT
PAD OB MATERNITY 4.3X12.25 (PERSONAL CARE ITEMS) ×4 IMPLANT
PAD PREP 24X41 OB/GYN DISP (PERSONAL CARE ITEMS) ×4 IMPLANT
SOL PREP PVP 2OZ (MISCELLANEOUS)
SOLUTION PREP PVP 2OZ (MISCELLANEOUS) IMPLANT
SUT ETHILON 3-0 FS-10 30 BLK (SUTURE) ×4
SUT VIC AB 2-0 SH 27 (SUTURE)
SUT VIC AB 2-0 SH 27XBRD (SUTURE) IMPLANT
SUT VIC AB 3-0 SH 27 (SUTURE)
SUT VIC AB 3-0 SH 27X BRD (SUTURE) IMPLANT
SUT VIC AB 4-0 SH 27 (SUTURE) ×12
SUT VIC AB 4-0 SH 27XANBCTRL (SUTURE) ×6 IMPLANT
SUTURE EHLN 3-0 FS-10 30 BLK (SUTURE) ×2 IMPLANT
SYR CONTROL 10ML LL (SYRINGE) ×8 IMPLANT

## 2020-08-27 NOTE — Discharge Instructions (Signed)

## 2020-08-27 NOTE — Anesthesia Preprocedure Evaluation (Signed)
Anesthesia Evaluation  Patient identified by MRN, date of birth, ID band Patient awake    Reviewed: Allergy & Precautions, NPO status , Patient's Chart, lab work & pertinent test results  History of Anesthesia Complications Negative for: history of anesthetic complications  Airway Mallampati: II  TM Distance: >3 FB Neck ROM: Full    Dental  (+) Implants   Pulmonary neg pulmonary ROS, neg sleep apnea, neg COPD,    breath sounds clear to auscultation- rhonchi (-) wheezing      Cardiovascular Exercise Tolerance: Good hypertension, Pt. on medications (-) CAD, (-) Past MI, (-) Cardiac Stents and (-) CABG  Rhythm:Regular Rate:Normal - Systolic murmurs and - Diastolic murmurs    Neuro/Psych neg Seizures PSYCHIATRIC DISORDERS Anxiety Depression negative neurological ROS     GI/Hepatic negative GI ROS, Neg liver ROS,   Endo/Other  negative endocrine ROS  Renal/GU negative Renal ROS     Musculoskeletal  (+) Arthritis ,   Abdominal (+) - obese,   Peds  Hematology negative hematology ROS (+)   Anesthesia Other Findings Past Medical History: No date: Allergy No date: Anxiety No date: Arthritis 10/2016: Breast mass     Comment:  right benign No date: Depression No date: Hypertension No date: Pre-diabetes     Comment:  diet controlled   Reproductive/Obstetrics                             Anesthesia Physical Anesthesia Plan  ASA: II  Anesthesia Plan: General   Post-op Pain Management:    Induction: Intravenous  PONV Risk Score and Plan: 2  Airway Management Planned: LMA  Additional Equipment:   Intra-op Plan:   Post-operative Plan:   Informed Consent: I have reviewed the patients History and Physical, chart, labs and discussed the procedure including the risks, benefits and alternatives for the proposed anesthesia with the patient or authorized representative who has indicated  his/her understanding and acceptance.     Dental advisory given  Plan Discussed with: CRNA and Anesthesiologist  Anesthesia Plan Comments:         Anesthesia Quick Evaluation

## 2020-08-27 NOTE — Transfer of Care (Signed)
Immediate Anesthesia Transfer of Care Note  Patient: Denise Rasmussen  Procedure(s) Performed: COLPOSCOPY WITH BIOPSIES (N/A Perineum) WIDE EXCISION VULVECTOMY (N/A )  Patient Location: PACU  Anesthesia Type:General  Level of Consciousness: awake and alert   Airway & Oxygen Therapy: Patient Spontanous Breathing  Post-op Assessment: Report given to RN  Post vital signs: Reviewed and stable  Last Vitals:  Vitals Value Taken Time  BP 144/74 08/27/20 1347  Temp    Pulse 74 08/27/20 1347  Resp 21 08/27/20 1347  SpO2 99 % 08/27/20 1347    Last Pain:  Vitals:   08/27/20 1131  TempSrc: Temporal  PainSc: 0-No pain         Complications: No complications documented.

## 2020-08-27 NOTE — Op Note (Signed)
  Operative Note   12/24/2015 10:25 AM  PRE-OP DIAGNOSIS: Vulvar dysplasia, 2-3   POST-OP DIAGNOSIS: Vulvar dysplasia, 2-3  SURGEON: Surgeon(s) and Role:    * Kihanna Kamiya Leta Jungling, MD - Primary  ANESTHESIA: General   PROCEDURE: Procedure(s): EXAM UNDER ANESTHESIA, COLPOSCOPY WITH VULVAR BIOPSIES; WIDE LOCAL VULVAR EXCISION  ESTIMATED BLOOD LOSS: Minimal  DRAINS: None  TOTAL IV FLUIDS: 100cc  SPECIMENS:  Vulvar biopsies x 3; vulvar WLE specimen  COMPLICATIONS: None  DISPOSITION: PACU - hemodynamically stable.  CONDITION: stable  INDICATIONS: Vulvar dysplasia, 2-3  PROCEDURE IN DETAIL: FINDINGS: Vulvar irregular nodular lesion at the posterior forchette lesion located at the posterior forchette; pigmented lesion right and left labia minora; mild AWE lesion 7 o'clock right vulva; Vagina: normal vagina; Cervix: no lesions; Rectal: no perianal lesions. Intraoperative frozen section of 7 o'clock biopsy was negative for high grade dysplasia.   PROCEDURE IN DETAIL: After informed consent was obtained, the patient was taken to the operating room where anesthesia was obtained without difficulty. The patient was positioned in the dorsal lithotomy position in [place] stirrups and her arms were carefully tucked at her sides and the usual precautions were taken.  She was prepped and draped in normal sterile fashion.  Time-out was performed.  Exam under anesthesia and colposcopy performed with above noted findings. The vulva was infiltrated with  ~5cc of 0.25% Marcaine without epinephrine. The lesion was outlined to obtain at least a  0.5 margin circumferentially. The vulvar lesion extended all the vaginal introitus grossly. The skin was incised with a scapel circumferentially and the dermis and subcutaneous tissue was transected using cauterization.  The lesion was removed entirely and a stitch was placed at 12 o'clock.   The defect was approximately 3.5  cm. The wound was irrigated copiously  and the wound closed using 4-0 Vicryl vertical mattress sutures.  The wound was intact with sutures without tension. Hemostasis was observed. The wound was infiltrated with Exparel.   The patient tolerated the procedure well.  Sponge, lap and needle counts were correct x2.  The patient was taken to recovery room in excellent condition.  Antibiotics:  Given 1st or 2nd generation cephalosporin, Antibiotics given within 1 hour of the start of the procedure, Antibiotics ordered to be discontinued within 24 hours post procedure  VTE prophylaxis: was ordered perioperatively.    Artelia Laroche, MD

## 2020-08-27 NOTE — Anesthesia Procedure Notes (Signed)
Procedure Name: LMA Insertion Date/Time: 08/27/2020 12:41 PM Performed by: Lanora Manis, CRNA Pre-anesthesia Checklist: Patient identified, Patient being monitored, Timeout performed, Emergency Drugs available and Suction available Patient Re-evaluated:Patient Re-evaluated prior to induction Oxygen Delivery Method: Circle system utilized Preoxygenation: Pre-oxygenation with 100% oxygen Induction Type: IV induction Ventilation: Mask ventilation without difficulty LMA: LMA inserted LMA Size: 3.5 Tube type: Oral Number of attempts: 1 Placement Confirmation: positive ETCO2 and breath sounds checked- equal and bilateral Tube secured with: Tape Dental Injury: Teeth and Oropharynx as per pre-operative assessment

## 2020-08-27 NOTE — Anesthesia Postprocedure Evaluation (Signed)
Anesthesia Post Note  Patient: Denise Rasmussen  Procedure(s) Performed: COLPOSCOPY WITH BIOPSIES (N/A Perineum) WIDE EXCISION VULVECTOMY (N/A )  Patient location during evaluation: PACU Anesthesia Type: General Level of consciousness: awake and alert and oriented Pain management: pain level controlled Vital Signs Assessment: post-procedure vital signs reviewed and stable Respiratory status: spontaneous breathing, nonlabored ventilation and respiratory function stable Cardiovascular status: blood pressure returned to baseline and stable Postop Assessment: no signs of nausea or vomiting Anesthetic complications: no   No complications documented.   Last Vitals:  Vitals:   08/27/20 1420 08/27/20 1438  BP: (!) 160/68 (!) 145/75  Pulse: 90 77  Resp:  18  Temp: (!) 36.4 C   SpO2: 95% 99%    Last Pain:  Vitals:   08/27/20 1438  TempSrc:   PainSc: 0-No pain                 [name]

## 2020-08-27 NOTE — Interval H&P Note (Signed)
History and Physical Interval Note:  08/27/2020 12:29 PM  Denise Rasmussen  has presented today for surgery, with the diagnosis of vulvar dysplasia.  The various methods of treatment have been discussed with the patient and family. After consideration of risks, benefits and other options for treatment, the patient has consented to  Procedure(s): COLPOSCOPY WITH BIOPSIES (N/A) WIDE EXCISION VULVECTOMY (N/A) as a surgical intervention.  The patient's history has been reviewed, patient examined, no change in status, stable for surgery.  I have reviewed the patient's chart and labs.  Questions were answered to the patient's satisfaction.     Kaydon Creedon [employer]

## 2020-08-28 ENCOUNTER — Encounter: Payer: Self-pay | Admitting: Obstetrics and Gynecology

## 2020-08-28 LAB — SURGICAL PATHOLOGY

## 2020-09-03 ENCOUNTER — Ambulatory Visit
Admission: RE | Admit: 2020-09-03 | Discharge: 2020-09-03 | Disposition: A | Discharge status: Home / Self Care | Payer: BC Managed Care – PPO | Source: Ambulatory Visit | Attending: Obstetrics and Gynecology | Admitting: Obstetrics and Gynecology

## 2020-09-03 ENCOUNTER — Other Ambulatory Visit: Payer: Self-pay

## 2020-09-03 DIAGNOSIS — Z1231 Encounter for screening mammogram for malignant neoplasm of breast: Secondary | ICD-10-CM | POA: Insufficient documentation

## 2020-09-10 ENCOUNTER — Telehealth: Payer: Self-pay | Admitting: Obstetrics and Gynecology

## 2020-09-10 NOTE — Telephone Encounter (Signed)
I called Ms. Lamia to review final pathology results. There was no answer and I left a message on her identified voice mail. Wonderful news as no evidence of malignancy and negative margins. No  further treatment is required. She will return to clinic as scheduled.    DIAGNOSIS:  A. VULVA, RIGHT AT 7:00; BIOPSY:  - BENIGN SQUAMOUS MUCOSA.  - NEGATIVE FOR DYSPLASIA AND MALIGNANCY.   B. VULVA, LEFT LABIA MINORA AT 3:00; BIOPSY:  - BENIGN SQUAMOUS MUCOSA.  - NEGATIVE FOR DYSPLASIA AND MALIGNANCY.   C. VULVA, RIGHT LABIA MINORA AT 9:00; BIOPSY:  - BENIGN SQUAMOUS MUCOSA.  - NEGATIVE FOR DYSPLASIA AND MALIGNANCY.   D. VULVA, POSTERIOR MIDLINE; EXCISION:  - HIGH-GRADE SQUAMOUS INTRAEPITHELIAL LESION (VIN 3, SEVERE DYSPLASIA).  - NEGATIVE FOR INVASIVE CARCINOMA.  - ALL SURGICAL MARGINS FREE OF DYSPLASIA.   Amika Tassin Leta Jungling, MD

## 2020-09-22 ENCOUNTER — Encounter: Payer: Self-pay | Admitting: Obstetrics and Gynecology

## 2020-09-24 ENCOUNTER — Encounter: Payer: Self-pay | Admitting: Obstetrics and Gynecology

## 2020-09-24 ENCOUNTER — Other Ambulatory Visit: Payer: Self-pay

## 2020-09-24 ENCOUNTER — Inpatient Hospital Stay: Payer: BC Managed Care – PPO | Attending: Obstetrics and Gynecology | Admitting: Obstetrics and Gynecology

## 2020-09-24 ENCOUNTER — Other Ambulatory Visit: Payer: Self-pay | Admitting: Nurse Practitioner

## 2020-09-24 ENCOUNTER — Telehealth: Payer: Self-pay | Admitting: *Deleted

## 2020-09-24 VITALS — BP 119/61 | HR 63 | Temp 98.0°F | Resp 20 | Wt 160.7 lb

## 2020-09-24 DIAGNOSIS — Z9079 Acquired absence of other genital organ(s): Secondary | ICD-10-CM | POA: Diagnosis not present

## 2020-09-24 DIAGNOSIS — Z87440 Personal history of urinary (tract) infections: Secondary | ICD-10-CM | POA: Diagnosis not present

## 2020-09-24 DIAGNOSIS — D071 Carcinoma in situ of vulva: Secondary | ICD-10-CM | POA: Diagnosis not present

## 2020-09-24 LAB — URINALYSIS, COMPLETE (UACMP) WITH MICROSCOPIC
Bilirubin Urine: NEGATIVE
Glucose, UA: NEGATIVE mg/dL
Hgb urine dipstick: NEGATIVE
Ketones, ur: NEGATIVE mg/dL
Nitrite: NEGATIVE
Protein, ur: NEGATIVE mg/dL
Specific Gravity, Urine: 1.019 (ref 1.005–1.030)
pH: 5 (ref 5.0–8.0)

## 2020-09-24 MED ORDER — NITROFURANTOIN MONOHYD MACRO 100 MG PO CAPS: 100.0000 mg | ORAL_CAPSULE | Freq: Two times a day (BID) | ORAL | 0 refills | Status: DC

## 2020-09-24 NOTE — Telephone Encounter (Signed)
Call returned to patient and advised that prescription sent to pharmacy

## 2020-09-24 NOTE — Progress Notes (Signed)
Gynecologic Oncology Consult Visit   Referring Provider: Dr. 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 G2P2 female who is seen in consultation from Dr. 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   Underwent WLE with Dr. Sonia Side on 08/27/20.   Pathology:  DIAGNOSIS:  A. VULVA, RIGHT AT 7:00; BIOPSY:  - BENIGN SQUAMOUS MUCOSA.  - NEGATIVE FOR DYSPLASIA AND MALIGNANCY.   B. VULVA, LEFT LABIA MINORA AT 3:00; BIOPSY:  - BENIGN SQUAMOUS MUCOSA.  - NEGATIVE FOR DYSPLASIA AND MALIGNANCY.   C. VULVA, RIGHT LABIA MINORA AT 9:00; BIOPSY:  - BENIGN SQUAMOUS MUCOSA.  - NEGATIVE FOR DYSPLASIA AND MALIGNANCY.   D. VULVA, POSTERIOR MIDLINE; EXCISION:  - HIGH-GRADE SQUAMOUS INTRAEPITHELIAL LESION (VIN 3, SEVERE DYSPLASIA).  - NEGATIVE FOR INVASIVE CARCINOMA.  - ALL SURGICAL MARGINS FREE OF DYSPLASIA  We discussed that no adjuvant treatment would be recommended. Since surgery she reports some discomfort with sitting, possible yeast infection vs UTI. She continues to see DR. Beasley for pessary management for grade 2 anterior and apical prolapse. Due to [facility] we had recommended that she avoid pessary use until post-op visit at which time it could be readdressed. She complains of bladder fullness and difficulty with placing pessary.    Gynecologic History:  07/01/2020 vulvar nodules-3 small white verrucal lesions of the posterior fourchette -biopsies performed x 3.  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  Uses pessary for symptoms.  She is postmenopausal at age 57.  Denies history of abnormal Pap smears. G2P2    We discussed options for management including surgery versus topical therapy with imiquimod. Given risk of occult malignancy recommend  surgery with colposcopy of the vulvar, directed biopsies, and WLE. She opted for WLE.    Patient Active Problem List   Diagnosis Date Noted  . 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 Pure hypercholesterolemia 10/05/2017  . Vaccine counseling 10/05/2017  . Essential hypertension 02/04/2017  . Moderate episode of recurrent major depressive disorder (HCC) 02/04/2017  . Primary osteoarthritis of both hands 02/04/2017  . Ganglion cyst 07/01/2016  . Hand pain 06/30/2016  . HSV infection 05/03/2015  . Depression, major, single episode, mild (HCC) 05/03/2015  . Arthritis, degenerative 05/03/2015  . Post menopausal syndrome 05/03/2015  . Borderline diabetes mellitus 05/03/2015  . Raynaud's syndrome 05/03/2015  . Vitamin D deficiency 05/03/2015  . Cervical pain 07/26/2008  . Insomnia 01/24/2008   Past Medical History:  Diagnosis Date  . Allergy   . Anxiety   . Arthritis   . Breast mass 10/2016   right benign  . Depression   . Hypertension   . Pre-diabetes    diet controlled   Past Surgical History:  Procedure Laterality Date  . COLONOSCOPY    . COLPOSCOPY N/A 08/27/2020   Procedure: COLPOSCOPY WITH BIOPSIES;  Surgeon: Artelia Laroche, MD;  Location: [facility] ORS;  Service: Gynecology;  Laterality: N/A;  . vaginal delivery     x2  . VULVECTOMY N/A 08/27/2020   Procedure: WIDE EXCISION VULVECTOMY;  Surgeon: Artelia Laroche, MD;  Location: [facility] ORS;  Service: Gynecology;  Laterality: N/A;    Past Gynecologic History:  Menarche: 14 History of Abnormal pap: No Last pap: as per HPI Sexually active: yes  OB History  No obstetric history on file.    Family History  Problem Relation Age of Onset  . Breast cancer Maternal Grandmother   . Hypertension Mother   . Diabetes Mother   . Heart disease Mother    Social History   Tobacco Use  . Smoking status: Never Smoker  . Smokeless tobacco: Never Used  Vaping Use  . Vaping Use: Never used  Substance Use Topics  .  Alcohol use: Yes    Alcohol/week: 0.0 standard drinks    Comment: occasionally  . Drug use: No   Allergies:  Patient has no known allergies.  Current Outpatient Medications on File Prior to Visit  Medication Sig Dispense Refill  . acetaminophen (TYLENOL) 500 MG tablet Take 1,000 mg by mouth every 6 (six) hours as needed for moderate pain or headache.     . ALPRAZolam (XANAX) 0.5 MG tablet TAKE 1 TABLET BY MOUTH AS NEEDED FOR ANXIETY (Patient taking differently: Take 0.5 mg by mouth daily as needed for anxiety. ) 30 tablet 0  . buPROPion (WELLBUTRIN SR) 100 MG 12 hr tablet Take 50 mg by mouth daily.     . cetirizine (ZYRTEC) 10 MG tablet Take 10 mg by mouth daily.     . Cholecalciferol (VITAMIN D-3) 125 MCG (5000 UT) TABS Take 10,000 Units by mouth daily.    . DULoxetine (CYMBALTA) 60 MG capsule Take 60 mg by mouth every morning.    Marland Kitchen estradiol (ESTRACE) 0.1 MG/GM vaginal cream Place 1 Applicatorful vaginally daily as needed (dryness).   1  . lamoTRIgine (LAMICTAL) 200 MG tablet Take 200 mg by mouth daily.   1  . lisinopril (PRINIVIL,ZESTRIL) 10 MG tablet TAKE 1 TABLET(10 MG) BY MOUTH DAILY (Patient taking differently: Take 10 mg by mouth daily. ) 30 tablet 0  . metroNIDAZOLE (METROGEL) 0.75 % vaginal gel Place 1 Applicatorful vaginally daily as needed (irritation).    . Omega-3 Fatty Acids (FISH OIL) 1000 MG CAPS Take 1,000 mg by mouth daily.    . traZODone (DESYREL) 50 MG tablet Take 25-50 mg by mouth at bedtime as needed for sleep.     . valACYclovir (VALTREX) 1000 MG tablet Take 1,000 mg by mouth daily.    Marland Kitchen oxyCODONE (OXY IR/ROXICODONE) 5 MG immediate release tablet Take 1 tablet (5 mg total) by mouth every 4 (four) hours as needed for severe pain. Post operative pain (Patient not taking: Reported on 09/24/2020) 5 tablet 0   No current facility-administered medications on file prior to visit.    Review of Systems As per interval history  Objective:  Physical Examination:  Today's  Vitals   09/24/20 0815 09/24/20 0821  BP: 119/61   Pulse: 63   Resp: 20   Temp: 98 F (36.7 C)   SpO2: 100%   Weight: 160 lb 11.2 oz (72.9 kg)   PainSc:  0-No pain   Body mass index is 25.55 kg/m.   ECOG Performance Status: 0 - Asymptomatic  GENERAL: Patient is a well appearing female in no acute distress LUNGS:  Clear to auscultation bilaterally.   HEART:  Regular rate and rhythm.  SKIN:  Clear with no obvious rashes or skin changes. NEURO:  Nonfocal. Well oriented.  Appropriate affect.  Pelvic: Chaperoned by nursing EGBUS: Healing midline vulvar wound. Excellent granulation tissue with a small area of yellow exudate. There are 4 sutures still in place. No evidence of infection (cellulitis or fungal)   Lab Review No labs on site today   Radiologic Imaging:  No imaging on site today    Assessment:  LASHONA SCHAAF is a 64 y.o. 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 Pathology positive for VIN3 with negative margins. Wound separation, healing well.   Possible persistent UTI  Medical co-morbidities complicating care: HTN and borderline diabetes.   Plan:    I personally saw the patient and performed the physical exam and counseling. We will repeat exam in ~ 4 weeks.   Check ua and culture/sensitivies.   Denise Masse, NP scribed the note.   Denise Rasmussen Leta Jungling, MD

## 2020-09-24 NOTE — Telephone Encounter (Signed)
Patient called reporting that she sees the UA results in My Chart and is asking for prescription to be sent in to [facility].  Urinalysis, Complete w Microscopic Order: [id number] Status:  Final result Visible to patient:  Yes (seen) Next appt:  11/05/2020 at 10:00 AM in Oncology ([facility]) Dx:  VIN III (vulvar intraepithelial neopl...  0 Result Notes  Ref Range & Units 08:47  Color, Urine YELLOW YELLOWAbnormal   APPearance CLEAR CLOUDYAbnormal   Specific Gravity, Urine 1.005 - 1.030 1.019   pH 5.0 - 8.0 5.0   Glucose, UA NEGATIVE mg/dL NEGATIVE   Hgb urine dipstick NEGATIVE NEGATIVE   Bilirubin Urine NEGATIVE NEGATIVE   Ketones, ur NEGATIVE mg/dL NEGATIVE   Protein, ur NEGATIVE mg/dL NEGATIVE   Nitrite NEGATIVE NEGATIVE   Leukocytes,Ua NEGATIVE SMALLAbnormal   RBC / HPF 0 - 5 RBC/hpf 0-5   WBC, UA 0 - 5 WBC/hpf 6-10   Bacteria, UA NONE SEEN RAREAbnormal   Squamous Epithelial / LPF 0 - 5 11-20   Mucus  PRESENT   Comment: Performed at [facility], [address], [place], [place] [address]  Resulting Agency  [facility]      Specimen Collected: 09/24/20 08:47 Last Resulted: 09/24/20 09:21

## 2020-09-24 NOTE — Telephone Encounter (Signed)
Please notify patient that I have sent in [facility] for her infection.

## 2020-09-26 LAB — URINE CULTURE

## 2020-10-08 ENCOUNTER — Ambulatory Visit: Payer: BC Managed Care – PPO

## 2020-11-05 ENCOUNTER — Inpatient Hospital Stay: Payer: BC Managed Care – PPO | Attending: Obstetrics and Gynecology | Admitting: Obstetrics and Gynecology

## 2020-11-05 VITALS — BP 120/70 | HR 74 | Temp 97.3°F | Resp 20 | Wt 161.3 lb

## 2020-11-05 DIAGNOSIS — R7303 Prediabetes: Secondary | ICD-10-CM | POA: Insufficient documentation

## 2020-11-05 DIAGNOSIS — I7300 Raynaud's syndrome without gangrene: Secondary | ICD-10-CM | POA: Diagnosis not present

## 2020-11-05 DIAGNOSIS — F339 Major depressive disorder, recurrent, unspecified: Secondary | ICD-10-CM | POA: Insufficient documentation

## 2020-11-05 DIAGNOSIS — Z9889 Other specified postprocedural states: Secondary | ICD-10-CM | POA: Diagnosis not present

## 2020-11-05 DIAGNOSIS — I10 Essential (primary) hypertension: Secondary | ICD-10-CM | POA: Insufficient documentation

## 2020-11-05 DIAGNOSIS — R3915 Urgency of urination: Secondary | ICD-10-CM | POA: Insufficient documentation

## 2020-11-05 DIAGNOSIS — D071 Carcinoma in situ of vulva: Secondary | ICD-10-CM | POA: Diagnosis present

## 2020-11-05 DIAGNOSIS — N390 Urinary tract infection, site not specified: Secondary | ICD-10-CM | POA: Diagnosis not present

## 2020-11-05 DIAGNOSIS — E559 Vitamin D deficiency, unspecified: Secondary | ICD-10-CM | POA: Insufficient documentation

## 2020-11-05 DIAGNOSIS — R350 Frequency of micturition: Secondary | ICD-10-CM

## 2020-11-05 DIAGNOSIS — Z79899 Other long term (current) drug therapy: Secondary | ICD-10-CM | POA: Diagnosis not present

## 2020-11-05 DIAGNOSIS — E7800 Pure hypercholesterolemia, unspecified: Secondary | ICD-10-CM | POA: Diagnosis not present

## 2020-11-05 DIAGNOSIS — M1990 Unspecified osteoarthritis, unspecified site: Secondary | ICD-10-CM | POA: Diagnosis not present

## 2020-11-05 DIAGNOSIS — Z79890 Hormone replacement therapy (postmenopausal): Secondary | ICD-10-CM | POA: Diagnosis not present

## 2020-11-05 LAB — URINALYSIS, COMPLETE (UACMP) WITH MICROSCOPIC
Bacteria, UA: NONE SEEN
Bilirubin Urine: NEGATIVE
Glucose, UA: NEGATIVE mg/dL
Hgb urine dipstick: NEGATIVE
Ketones, ur: NEGATIVE mg/dL
Leukocytes,Ua: NEGATIVE
Nitrite: NEGATIVE
Protein, ur: NEGATIVE mg/dL
Specific Gravity, Urine: 1.015 (ref 1.005–1.030)
Squamous Epithelial / HPF: NONE SEEN (ref 0–5)
pH: 7 (ref 5.0–8.0)

## 2020-11-05 NOTE — Patient Instructions (Signed)

## 2020-11-05 NOTE — Progress Notes (Signed)
Gynecologic Oncology Interval Visit   Referring Provider: Dr. 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 G2P2 female who is seen in consultation from Dr. 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   At post op she some wound separation but site was healing well. She returns to re-evaluation post WLE with Dr. Sonia Side on 08/27/20. She uses pessary which she has started using again.  UTI at last visit. Culture suggestive of contamination but given symptoms, she was treated with macrobid which resolved her symptoms. She is concerned of some nodularity at surgical site. Has some urinary urgency intermittently.    Gynecologic History:  Denise Rasmussen is a pleasant patient who was seen consultation from Dr. 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 Her history is noted below.  07/01/2020 vulvar nodules-3 small white verrucal lesions of the posterior fourchette -biopsies performed x 3.  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  Uses pessary for symptoms.  She is postmenopausal at age 15.  Denies history of abnormal Pap smears. G2P2    We discussed options for management including surgery versus topical therapy with imiquimod. Given risk of occult malignancy recommend surgery with colposcopy of the vulvar, directed biopsies, and WLE. She opted for WLE.   Underwent WLE with Dr. Sonia Side on 08/27/20.   Pathology:  DIAGNOSIS:  A. VULVA, RIGHT AT 7:00; BIOPSY:  - BENIGN SQUAMOUS MUCOSA.  - NEGATIVE FOR DYSPLASIA AND MALIGNANCY.   B. VULVA, LEFT LABIA MINORA AT 3:00; BIOPSY:  - BENIGN SQUAMOUS MUCOSA.  - NEGATIVE FOR DYSPLASIA AND MALIGNANCY.   C. VULVA, RIGHT LABIA MINORA AT 9:00; BIOPSY:  - BENIGN SQUAMOUS MUCOSA.  - NEGATIVE FOR DYSPLASIA AND MALIGNANCY.   D. VULVA, POSTERIOR  MIDLINE; EXCISION:  - HIGH-GRADE SQUAMOUS INTRAEPITHELIAL LESION (VIN 3, SEVERE DYSPLASIA).  - NEGATIVE FOR INVASIVE CARCINOMA.  - ALL SURGICAL MARGINS FREE OF DYSPLASIA  We discussed that no adjuvant treatment would be recommended.  She is followed by Dr. Dalbert Garnet for pessary management for grade 2 anterior and apical prolapse. Due to [facility] we had recommended that she avoid pessary use until post-op visit at which time it could be readdressed. She complains of bladder fullness and difficulty with placing pessary.   Patient Active Problem List   Diagnosis Date Noted  . 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 Pure hypercholesterolemia 10/05/2017  . Vaccine counseling 10/05/2017  . Essential hypertension 02/04/2017  . Moderate episode of recurrent major depressive disorder (HCC) 02/04/2017  . Primary osteoarthritis of both hands 02/04/2017  . Ganglion cyst 07/01/2016  . Hand pain 06/30/2016  . HSV infection 05/03/2015  . Depression, major, single episode, mild (HCC) 05/03/2015  . Arthritis, degenerative 05/03/2015  . Post menopausal syndrome 05/03/2015  . Borderline diabetes mellitus 05/03/2015  . Raynaud's syndrome 05/03/2015  . Vitamin D deficiency 05/03/2015  . Cervical pain 07/26/2008  . Insomnia 01/24/2008   Past Medical History:  Diagnosis Date  . Allergy   . Anxiety   . Arthritis   . Breast mass 10/2016   right benign  . Depression   . Hypertension   . Pre-diabetes    diet controlled   Past Surgical History:  Procedure Laterality Date  . COLONOSCOPY    . COLPOSCOPY N/A 08/27/2020   Procedure: COLPOSCOPY  WITH BIOPSIES;  Surgeon: Artelia Laroche, MD;  Location: [facility] ORS;  Service: Gynecology;  Laterality: N/A;  . vaginal delivery     x2  . VULVECTOMY N/A 08/27/2020   Procedure: WIDE EXCISION VULVECTOMY;  Surgeon: Artelia Laroche, MD;  Location: [facility] ORS;  Service: Gynecology;  Laterality: N/A;   Past Gynecologic History:  Menarche: 14 History of Abnormal pap:  No Last pap: as per HPI Sexually active: yes  OB History   No obstetric history on file.    Family History  Problem Relation Age of Onset  . Breast cancer Maternal Grandmother   . Hypertension Mother   . Diabetes Mother   . Heart disease Mother    Social History   Tobacco Use  . Smoking status: Never Smoker  . Smokeless tobacco: Never Used  Vaping Use  . Vaping Use: Never used  Substance Use Topics  . Alcohol use: Yes    Alcohol/week: 0.0 standard drinks    Comment: occasionally  . Drug use: No   Immunization History  Administered Date(s) Administered  . Influenza Split 07/16/2009  . Influenza,inj,Quad PF,6+ Mos 08/15/2019  . Influenza,inj,quad, With Preservative 07/07/2016  . Influenza-Unspecified 07/25/2014, 07/06/2016, 07/05/2017, 08/02/2018, 07/16/2020  . PFIZER SARS-COV-2 Vaccination 12/08/2019, 12/29/2019  . Tdap 11/20/2010    Allergies:  Patient has no known allergies.  Current Outpatient Medications on File Prior to Visit  Medication Sig Dispense Refill  . acetaminophen (TYLENOL) 500 MG tablet Take 1,000 mg by mouth every 6 (six) hours as needed for moderate pain or headache.     . ALPRAZolam (XANAX) 0.5 MG tablet TAKE 1 TABLET BY MOUTH AS NEEDED FOR ANXIETY (Patient taking differently: Take 0.5 mg by mouth daily as needed for anxiety.) 30 tablet 0  . buPROPion (WELLBUTRIN SR) 100 MG 12 hr tablet Take 50 mg by mouth daily.     . Cholecalciferol (VITAMIN D-3) 125 MCG (5000 UT) TABS Take 10,000 Units by mouth daily.    . DULoxetine (CYMBALTA) 60 MG capsule Take 60 mg by mouth every morning.    Marland Kitchen estradiol (ESTRACE) 0.1 MG/GM vaginal cream Place 1 Applicatorful vaginally daily as needed (dryness).   1  . lamoTRIgine (LAMICTAL) 200 MG tablet Take 200 mg by mouth daily.   1  . lisinopril (PRINIVIL,ZESTRIL) 10 MG tablet TAKE 1 TABLET(10 MG) BY MOUTH DAILY (Patient taking differently: Take 10 mg by mouth daily.) 30 tablet 0  . metroNIDAZOLE (METROGEL) 0.75 %  vaginal gel Place 1 Applicatorful vaginally daily as needed (irritation).    . nitrofurantoin, macrocrystal-monohydrate, (MACROBID) 100 MG capsule Take 1 capsule (100 mg total) by mouth 2 (two) times daily. 10 capsule 0  . Omega-3 Fatty Acids (FISH OIL) 1000 MG CAPS Take 1,000 mg by mouth daily.    . traZODone (DESYREL) 50 MG tablet Take 25-50 mg by mouth at bedtime as needed for sleep.     . valACYclovir (VALTREX) 1000 MG tablet Take 1,000 mg by mouth daily.    . cetirizine (ZYRTEC) 10 MG tablet Take 10 mg by mouth daily.  (Patient not taking: Reported on 11/05/2020)    . oxyCODONE (OXY IR/ROXICODONE) 5 MG immediate release tablet Take 1 tablet (5 mg total) by mouth every 4 (four) hours as needed for severe pain. Post operative pain (Patient not taking: No sig reported) 5 tablet 0   No current facility-administered medications on file prior to visit.    Review of Systems General:  No complaints Skin: no complaints Eyes: no complaints HEENT:  no complaints Breasts: no complaints Pulmonary: no complaints Cardiac: no complaints Gastrointestinal: constipation Genitourinary/Sexual: per hpi- pessary and urgency Ob/Gyn: additional nodularity at site of prior surgery per hpi Musculoskeletal: no complaints Hematology: no complaints Neurologic/Psych: no complaints   Objective:  Physical Examination:  Today's Vitals   11/05/20 1025 11/05/20 1028  BP: 120/70   Pulse: 74   Resp: 20   Temp: (!) 97.3 F (36.3 C)   SpO2: 98%   Weight: 161 lb 4.8 oz (73.2 kg)   PainSc:  0-No pain   Body mass index is 25.64 kg/m.   ECOG Performance Status: 0 - Asymptomatic  GENERAL: Patient is a well appearing female in no acute distress EXTREMITIES:  No peripheral edema.   SKIN:  Clear with no obvious rashes or skin changes.  NEURO:  Nonfocal. Well oriented.  Appropriate affect.  Pelvic: Chaperoned by CMA. EGBUS: Healed midline vulvar wound with white epithelium that most likely represents scarring.  There are no visible sutures. The area of nodularity are at the site of prior sutures.  Lab Review No labs on site today   Radiologic Imaging: No imaging on site today    Assessment:  Denise Rasmussen is a 65 y.o. 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 Pathology positive for VIN3 with negative margins. Wound separation, healed by secondary intention. No evidence of recurrence.  Possible persistent UTI  Medical co-morbidities complicating care: HTN and borderline diabetes.   Plan:   VIN III (vulvar intraepithelial neoplasia III) - Plan: Urinalysis, Complete w Microscopic, Urinalysis, Complete w Microscopic  Urinary frequency - Plan: Urinalysis, Complete w Microscopic, Urinalysis, Complete w Microscopic  We discussed findings and risk of recurrence. Risk of recurrence is low given negative margins but dysplasia can recur and we recommend close follow up every 6 months for 2 years. We can alternate follow up with Dr. Dalbert Garnet. She will return in 3 months to evaluate the vulvar area.   With regard to urinary symptoms we will repeat her urine culture with speciation given prior findings and persistent symptoms. We also discussed non-infectious etiology - use of cranberry supplements/juice and probiotics. She will also follow up with Dr. Dalbert Garnet.    Consuello Masse, DNP, AGNP-[facility] [phone] (clinic)  I personally had a face to face interaction and evaluated the patient jointly with the NP, Ms. Consuello Masse.  I have reviewed her history and available records and have performed the key portions of the physical exam including pelvic exam with my findings confirming those documented above by the APP.  I have discussed the case with the APP and the patient.  I agree with the above documentation, assessment and plan which was fully formulated by me.  Counseling was completed by me.   I personally saw the patient and performed a substantive portion of this encounter in  conjunction with the listed APP as documented above.  Edmund Rick Leta Jungling, MD

## 2020-11-11 ENCOUNTER — Encounter: Payer: Self-pay | Admitting: Obstetrics and Gynecology

## 2021-02-04 ENCOUNTER — Inpatient Hospital Stay: Payer: BC Managed Care – PPO | Attending: Obstetrics and Gynecology | Admitting: Obstetrics and Gynecology

## 2021-02-04 ENCOUNTER — Other Ambulatory Visit: Payer: Self-pay

## 2021-02-04 VITALS — BP 138/70 | HR 62 | Temp 97.2°F | Resp 18 | Wt 161.0 lb

## 2021-02-04 DIAGNOSIS — N905 Atrophy of vulva: Secondary | ICD-10-CM | POA: Diagnosis not present

## 2021-02-04 DIAGNOSIS — Z9889 Other specified postprocedural states: Secondary | ICD-10-CM | POA: Insufficient documentation

## 2021-02-04 DIAGNOSIS — R7303 Prediabetes: Secondary | ICD-10-CM | POA: Diagnosis not present

## 2021-02-04 DIAGNOSIS — I10 Essential (primary) hypertension: Secondary | ICD-10-CM | POA: Diagnosis not present

## 2021-02-04 DIAGNOSIS — F339 Major depressive disorder, recurrent, unspecified: Secondary | ICD-10-CM | POA: Insufficient documentation

## 2021-02-04 DIAGNOSIS — I7300 Raynaud's syndrome without gangrene: Secondary | ICD-10-CM | POA: Diagnosis not present

## 2021-02-04 DIAGNOSIS — N949 Unspecified condition associated with female genital organs and menstrual cycle: Secondary | ICD-10-CM

## 2021-02-04 DIAGNOSIS — D071 Carcinoma in situ of vulva: Secondary | ICD-10-CM | POA: Insufficient documentation

## 2021-02-04 DIAGNOSIS — Z780 Asymptomatic menopausal state: Secondary | ICD-10-CM | POA: Diagnosis not present

## 2021-02-04 DIAGNOSIS — Z79899 Other long term (current) drug therapy: Secondary | ICD-10-CM | POA: Insufficient documentation

## 2021-02-04 DIAGNOSIS — N8110 Cystocele, unspecified: Secondary | ICD-10-CM | POA: Diagnosis not present

## 2021-02-04 DIAGNOSIS — N959 Unspecified menopausal and perimenopausal disorder: Secondary | ICD-10-CM | POA: Diagnosis not present

## 2021-02-04 MED ORDER — ESTRADIOL 0.1 MG/GM VA CREA: 1.0000 | TOPICAL_CREAM | Freq: Every day | VAGINAL | 2 refills | Status: DC | PRN

## 2021-02-04 NOTE — Progress Notes (Signed)
Gynecologic Oncology Interval Visit   Referring Provider: Dr. Dalbert Garnet  Chief Complaint: VIN 3 s/p WLE  Subjective:  Denise Rasmussen is a 65 y.o. G2P2 female who is seen in consultation from Dr. 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   She presents for surveillance - at her last visit she has some nodularity at the wound site that we felt was most likely due to the sutures. She has no complaints today of itching or irritation. She is followed by Dr. Dalbert Garnet for pessary management for grade 2 anterior and apical prolapse. She has been using the pessary and is still considering surgery.   Gynecologic History:  Denise Rasmussen is a pleasant patient who was seen consultation from Dr. 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 Her history is noted below.  07/01/2020 vulvar nodules-3 small white verrucal lesions of the posterior fourchette -biopsies performed x 3.  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  Uses pessary for symptoms.  She is postmenopausal at age 59.  Denies history of abnormal Pap smears. G2P2    We discussed options for management including surgery versus topical therapy with imiquimod. Given risk of occult malignancy recommend surgery with colposcopy of the vulvar, directed biopsies, and WLE. She opted for WLE.   Underwent WLE with Dr. Sonia Side on 08/27/20.   Pathology:  DIAGNOSIS:  A. VULVA, RIGHT AT 7:00; BIOPSY:  - BENIGN SQUAMOUS MUCOSA.  - NEGATIVE FOR DYSPLASIA AND MALIGNANCY.   B. VULVA, LEFT LABIA MINORA AT 3:00; BIOPSY:  - BENIGN SQUAMOUS MUCOSA.  - NEGATIVE FOR DYSPLASIA AND MALIGNANCY.   C. VULVA, RIGHT LABIA MINORA AT 9:00; BIOPSY:  - BENIGN SQUAMOUS MUCOSA.  - NEGATIVE FOR DYSPLASIA AND MALIGNANCY.   D. VULVA, POSTERIOR MIDLINE; EXCISION:  - HIGH-GRADE SQUAMOUS INTRAEPITHELIAL LESION (VIN 3, SEVERE  DYSPLASIA).  - NEGATIVE FOR INVASIVE CARCINOMA.  - ALL SURGICAL MARGINS FREE OF DYSPLASIA  We discussed that no adjuvant treatment would be recommended.  Clinically NED.   Patient Active Problem List   Diagnosis Date Noted  . 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 Pure hypercholesterolemia 10/05/2017  . Vaccine counseling 10/05/2017  . Essential hypertension 02/04/2017  . Moderate episode of recurrent major depressive disorder (HCC) 02/04/2017  . Primary osteoarthritis of both hands 02/04/2017  . Ganglion cyst 07/01/2016  . Hand pain 06/30/2016  . HSV infection 05/03/2015  . Depression, major, single episode, mild (HCC) 05/03/2015  . Arthritis, degenerative 05/03/2015  . Post menopausal syndrome 05/03/2015  . Borderline diabetes mellitus 05/03/2015  . Raynaud's syndrome 05/03/2015  . Vitamin D deficiency 05/03/2015  . Cervical pain 07/26/2008  . Insomnia 01/24/2008   Past Medical History:  Diagnosis Date  . Allergy   . Anxiety   . Arthritis   . Breast mass 10/2016   right benign  . Depression   . Hypertension   . Pre-diabetes    diet controlled   Past Surgical History:  Procedure Laterality Date  . COLONOSCOPY    . COLPOSCOPY N/A 08/27/2020   Procedure: COLPOSCOPY WITH BIOPSIES;  Surgeon: Artelia Laroche, MD;  Location: [facility] ORS;  Service: Gynecology;  Laterality: N/A;  . vaginal delivery     x2  . VULVECTOMY N/A 08/27/2020   Procedure: WIDE EXCISION VULVECTOMY;  Surgeon: Artelia Laroche, MD;  Location: [facility] ORS;  Service: Gynecology;  Laterality:  N/A;   Past Gynecologic History:  Menarche: 14 History of Abnormal pap: No Last pap: as per HPI Sexually active: yes  OB History   No obstetric history on file.    Family History  Problem Relation Age of Onset  . Breast cancer Maternal Grandmother   . Hypertension Mother   . Diabetes Mother   . Heart disease Mother    Social History   Tobacco Use  . Smoking status: Never Smoker  . Smokeless  tobacco: Never Used  Vaping Use  . Vaping Use: Never used  Substance Use Topics  . Alcohol use: Yes    Alcohol/week: 0.0 standard drinks    Comment: occasionally  . Drug use: No   Immunization History  Administered Date(s) Administered  . Influenza Split 07/16/2009  . Influenza,inj,Quad PF,6+ Mos 08/15/2019  . Influenza,inj,quad, With Preservative 07/07/2016  . Influenza-Unspecified 07/25/2014, 07/06/2016, 07/05/2017, 08/02/2018, 07/16/2020  . PFIZER(Purple Top)SARS-COV-2 Vaccination 12/08/2019, 12/29/2019, 08/11/2020  . Tdap 11/20/2010    Allergies:  Patient has no known allergies.  Current Outpatient Medications on File Prior to Visit  Medication Sig Dispense Refill  . acetaminophen (TYLENOL) 500 MG tablet Take 1,000 mg by mouth every 6 (six) hours as needed for moderate pain or headache.     Marland Kitchen buPROPion (WELLBUTRIN SR) 100 MG 12 hr tablet Take 50 mg by mouth daily.     . Cholecalciferol (VITAMIN D-3) 125 MCG (5000 UT) TABS Take 10,000 Units by mouth daily.    . DULoxetine (CYMBALTA) 60 MG capsule Take 60 mg by mouth every morning.    . lamoTRIgine (LAMICTAL) 200 MG tablet Take 200 mg by mouth daily.   1  . lisinopril (PRINIVIL,ZESTRIL) 10 MG tablet TAKE 1 TABLET(10 MG) BY MOUTH DAILY (Patient taking differently: Take 10 mg by mouth daily.) 30 tablet 0  . ALPRAZolam (XANAX) 0.5 MG tablet TAKE 1 TABLET BY MOUTH AS NEEDED FOR ANXIETY (Patient not taking: Reported on 02/04/2021) 30 tablet 0  . metroNIDAZOLE (METROGEL) 0.75 % vaginal gel Place 1 Applicatorful vaginally daily as needed (irritation). (Patient not taking: Reported on 02/04/2021)    . traZODone (DESYREL) 50 MG tablet Take 25-50 mg by mouth at bedtime as needed for sleep.  (Patient not taking: Reported on 02/04/2021)    . valACYclovir (VALTREX) 1000 MG tablet Take 1,000 mg by mouth daily. (Patient not taking: Reported on 02/04/2021)     No current facility-administered medications on file prior to visit.    Review of  Systems General: no complaints  HEENT: no complaints  Lungs: no complaints  Cardiac: no complaints  GI: no complaints  GU: no complaints  Musculoskeletal: no complaints  Extremities: no complaints  Skin: no complaints  Neuro: no complaints  Endocrine: no complaints  Psych: no complaints        Objective:  Physical Examination:  Today's Vitals   02/04/21 1108  BP: 138/70  Pulse: 62  Resp: 18  Temp: (!) 97.2 F (36.2 C)  SpO2: 100%  Weight: 161 lb (73 kg)  PainSc: 0-No pain   Body mass index is 25.6 kg/m.   Performance status:    GENERAL: Patient is a well appearing female in no acute distress HEENT:  PERRL, neck supple with midline trachea.  NODES:  No inguinal lymphadenopathy palpated.  MSK:  No focal spinal tenderness to palpation. Full range of motion bilaterally in the upper extremities. EXTREMITIES:  No peripheral edema.   NEURO:  Nonfocal. Well oriented.  Appropriate affect.  Pelvic: EGBUS: vulvar atrophy.  No gross lesions. The perineal site of the wound has healed and scarring present. With minimal palpation she deveoped a small 5 mm fissure. The nodularity is still present but no gross lesions.  Cervix: no lesions, nontender, mobile Vagina: no lesions, no discharge or bleeding Uterus: normal size, nontender, mobile Adnexa: no palpable masses Rectovaginal: deferred  Lab Review No labs on site today   Radiologic Imaging: No imaging on site today    Assessment:  Denise Rasmussen is a 65 y.o. 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 Pathology positive for VIN3 with negative margins. Wound separation, healed by secondary intention. No evidence of recurrence.  Vulvar atrophy  Medical co-morbidities complicating care: HTN and borderline diabetes.   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 She will continue to follow up with Dr. Dalbert Garnet for pessary management.  She will return in 6 months and if all stable we can alternate the next visit with Dr. Dalbert Garnet. Precautions were given for symptoms of recurrence.   Recommend topical vulvar estrogen therapy 2x a week. There should be minimal systemic absorption with this approach. We discussed risk of endometrial neoplasia and cancer which should be minimal. Consuello Masse NP also reviewed other options with her such as moisturizers and lubricants.  Terel Bann Leta Jungling, MD

## 2021-02-05 ENCOUNTER — Telehealth: Payer: Self-pay | Admitting: *Deleted

## 2021-02-05 NOTE — Telephone Encounter (Signed)
Please notify pharmacy of updated instructions: 4 g intravaginally for 1 week then 2 g intravaginally for 1 week then 1 g intravaginally for 1 week then 1 g intravaginally three times per week for vulvar and vaginal atrophy.

## 2021-02-05 NOTE — Telephone Encounter (Signed)
Pharmacy requesting a return call to verify that patient is to use 1 Full applicator of Estrace which is 4 Gm of medicine and she states is a high dose [phone]

## 2021-02-05 NOTE — Telephone Encounter (Signed)
Call returned to pharmacy and VERBAL ORDER of new instructions given and repeated back

## 2021-04-09 ENCOUNTER — Encounter: Payer: Self-pay | Admitting: Nurse Practitioner

## 2021-04-09 ENCOUNTER — Other Ambulatory Visit: Payer: Self-pay

## 2021-04-09 ENCOUNTER — Ambulatory Visit: Payer: Self-pay | Admitting: Nurse Practitioner

## 2021-04-09 VITALS — BP 120/68 | HR 88 | Temp 98.5°F | Resp 16 | Ht 67.0 in | Wt 162.0 lb

## 2021-04-09 DIAGNOSIS — S60551A Superficial foreign body of right hand, initial encounter: Secondary | ICD-10-CM

## 2021-04-09 DIAGNOSIS — S00412A Abrasion of left ear, initial encounter: Secondary | ICD-10-CM

## 2021-04-09 NOTE — Progress Notes (Signed)
   Subjective:    Patient ID: Denise Rasmussen, female    DOB: February 03, 1956, 65 y.o.   MRN: [id number]  HPI  She has been using Q tips to help relieve pressure in her ears and feels she may have scratched her ear and would like to have her ears assessed.   Feels she may have a retained splinter in right hand finger from weeding garden last night.   Today's Vitals   04/09/21 1508  BP: 120/68  Pulse: 88  Resp: 16  Temp: 98.5 F (36.9 C)  TempSrc: Tympanic  SpO2: 96%  Weight: 162 lb (73.5 kg)  Height: 5\' 7"  (1.702 m)   Body mass index is 25.37 kg/m.    Review of Systems  Constitutional: Negative.   HENT:  Positive for ear pain.   Respiratory: Negative.    Cardiovascular: Negative.   Musculoskeletal: Negative.   Skin:  Positive for wound.  Neurological: Negative.    Current Outpatient Medications  Medication Instructions   acetaminophen (TYLENOL) 1,000 mg, Every 6 hours PRN   ALPRAZolam (XANAX) 0.5 MG tablet TAKE 1 TABLET BY MOUTH AS NEEDED FOR ANXIETY   buPROPion (WELLBUTRIN SR) 50 mg, Oral, Daily   DULoxetine (CYMBALTA) 60 mg, Oral, Every morning   estradiol (ESTRACE) 0.1 MG/GM vaginal cream 1 Applicatorful, Vaginal, Daily PRN   lamoTRIgine (LAMICTAL) 200 mg, Oral, Daily   lisinopril (PRINIVIL,ZESTRIL) 10 MG tablet TAKE 1 TABLET(10 MG) BY MOUTH DAILY   metroNIDAZOLE (METROGEL) 0.75 % vaginal gel 1 Applicatorful, Vaginal, Daily PRN   traZODone (DESYREL) 25-50 mg, At bedtime PRN   valACYclovir (VALTREX) 1,000 mg, Oral, Daily   Vitamin D-3 10,000 Units, Oral, Daily       Objective:   Physical Exam HENT:     Head: Normocephalic.     Right Ear: Hearing, tympanic membrane, ear canal and external ear normal.     Left Ear: Hearing, tympanic membrane and external ear normal.     Ears:     Comments: Small abrasion with scab to inner lower left canal.  Skin:    General: Skin is warm.     Comments: Splinter to right hand first digit.   Neurological:     General: No focal  deficit present.     Mental Status: She is alert.          Assessment & Plan:   Advised vasoline or ointment to inner ear to help sooth abrasion- likely secondary to Q tip or finger. Avoid foreign objects in ear canal   RTC if ear irritation continues or with new symptoms   Splinter cleaned and removed patient tolerated well.

## 2021-08-01 IMAGING — MG DIGITAL SCREENING BILAT W/ TOMO W/ CAD
6 of 12 series · 6 of 36 positions shown · non-contrast
Comparison: Previous exam(s).

CLINICAL DATA: Screening.

EXAM:
DIGITAL SCREENING BILATERAL MAMMOGRAM WITH TOMO AND CAD

[L CC synth-2D]
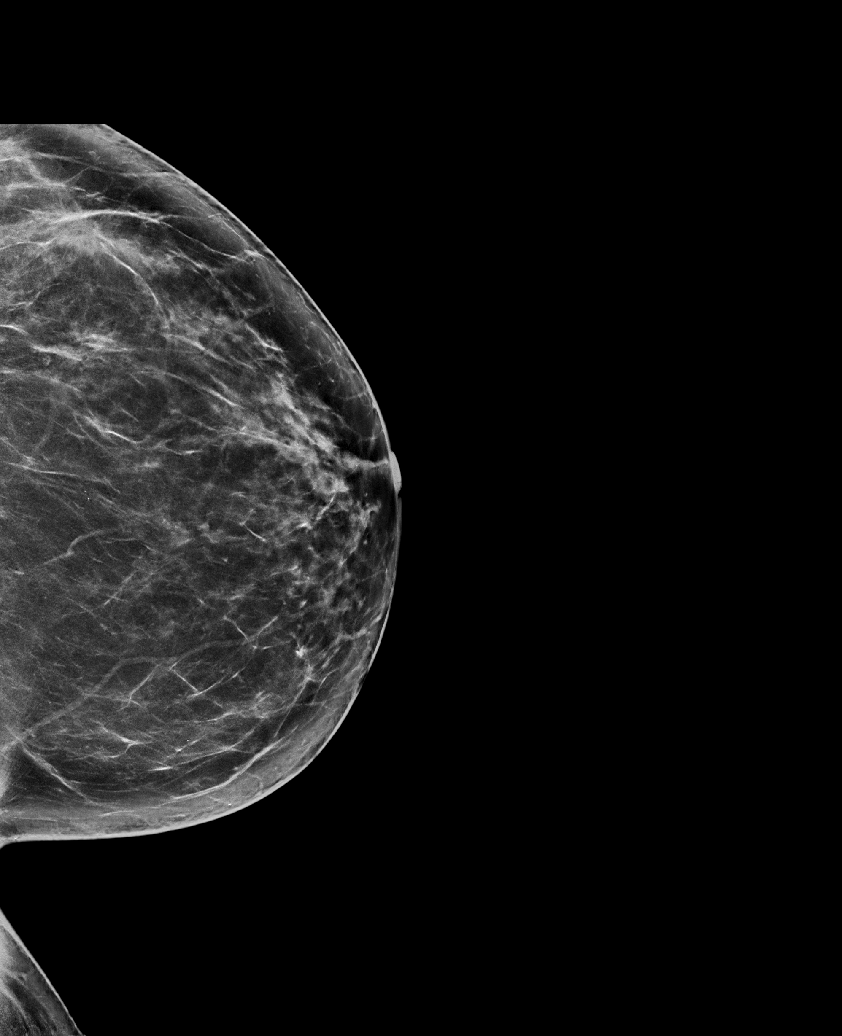

[R MLO synth-2D (1 of 2)]
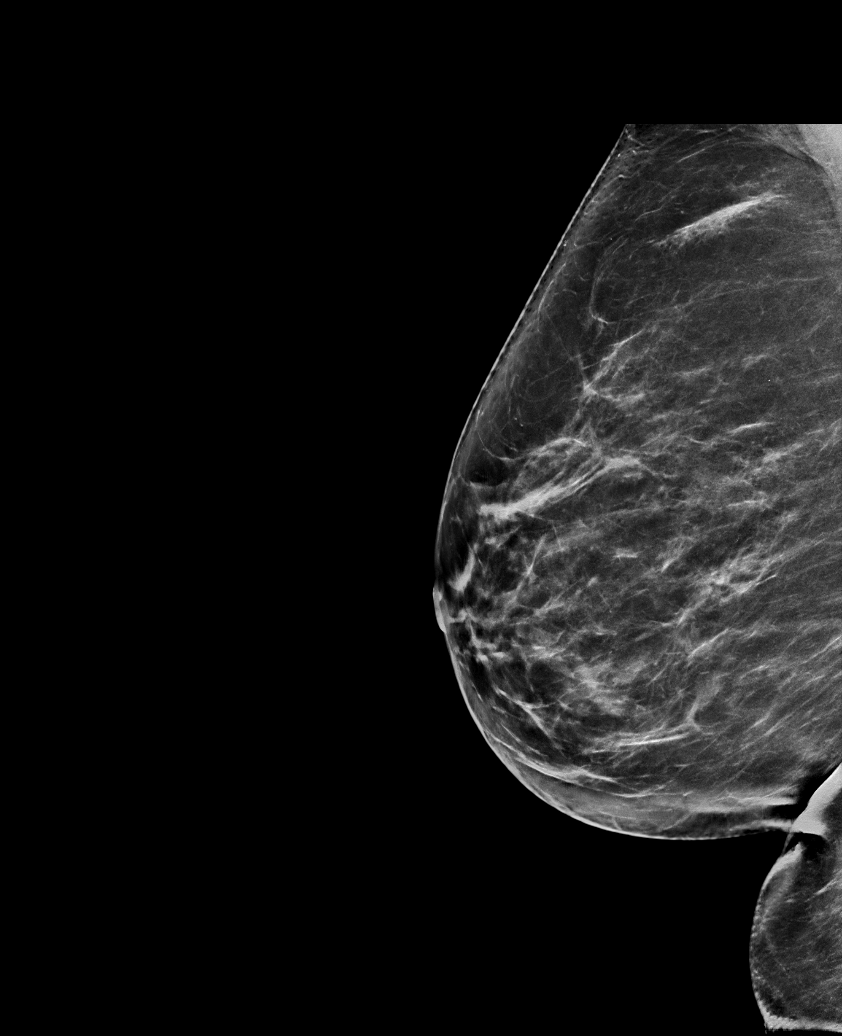

[L MLO synth-2D (1 of 2)]
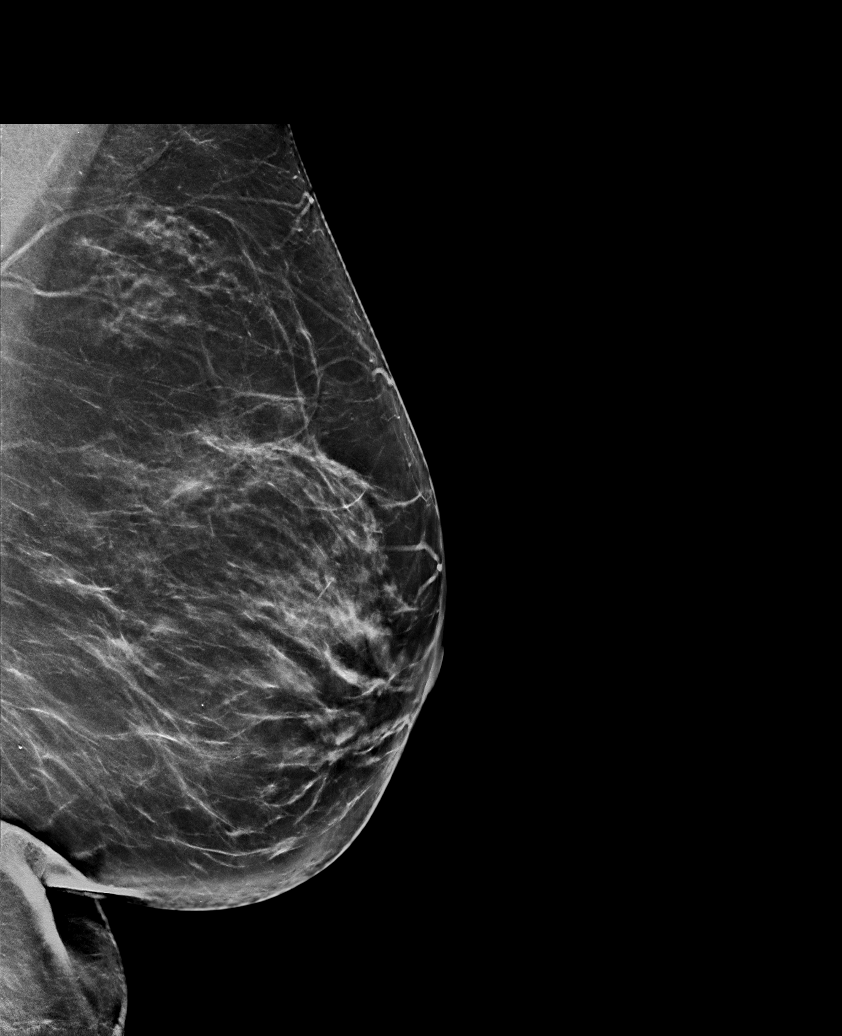

[L MLO synth-2D (2 of 2)]
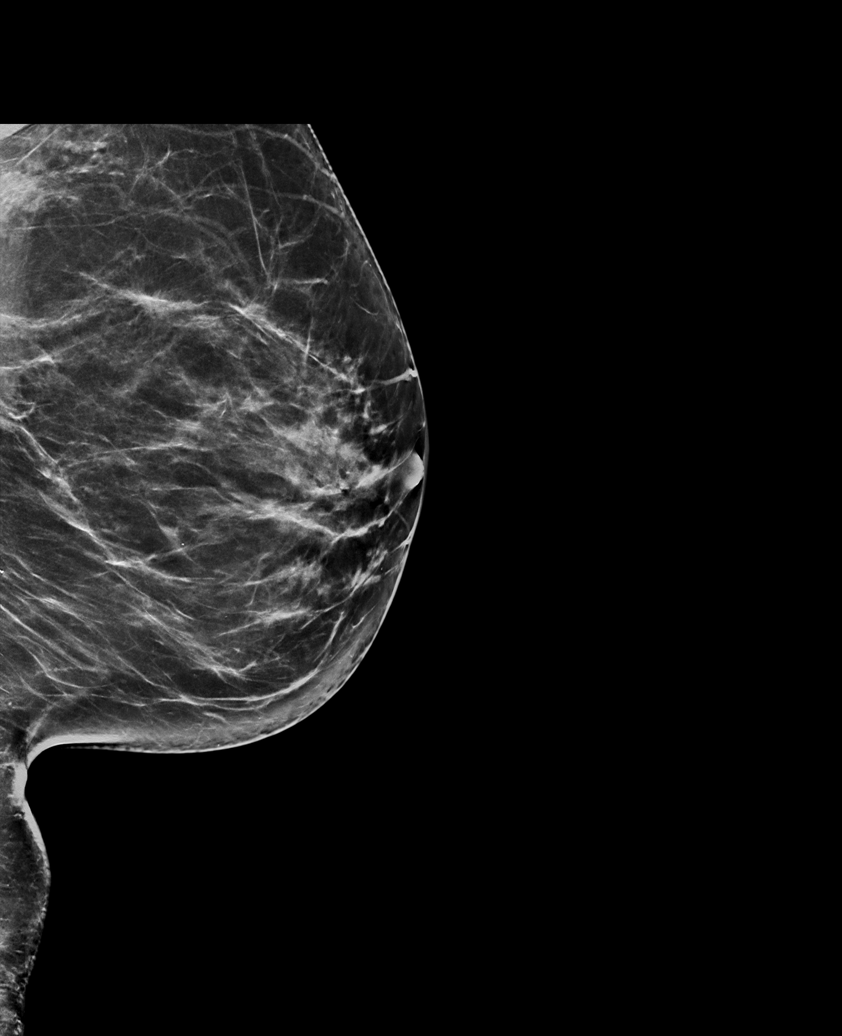

[R MLO synth-2D (2 of 2)]
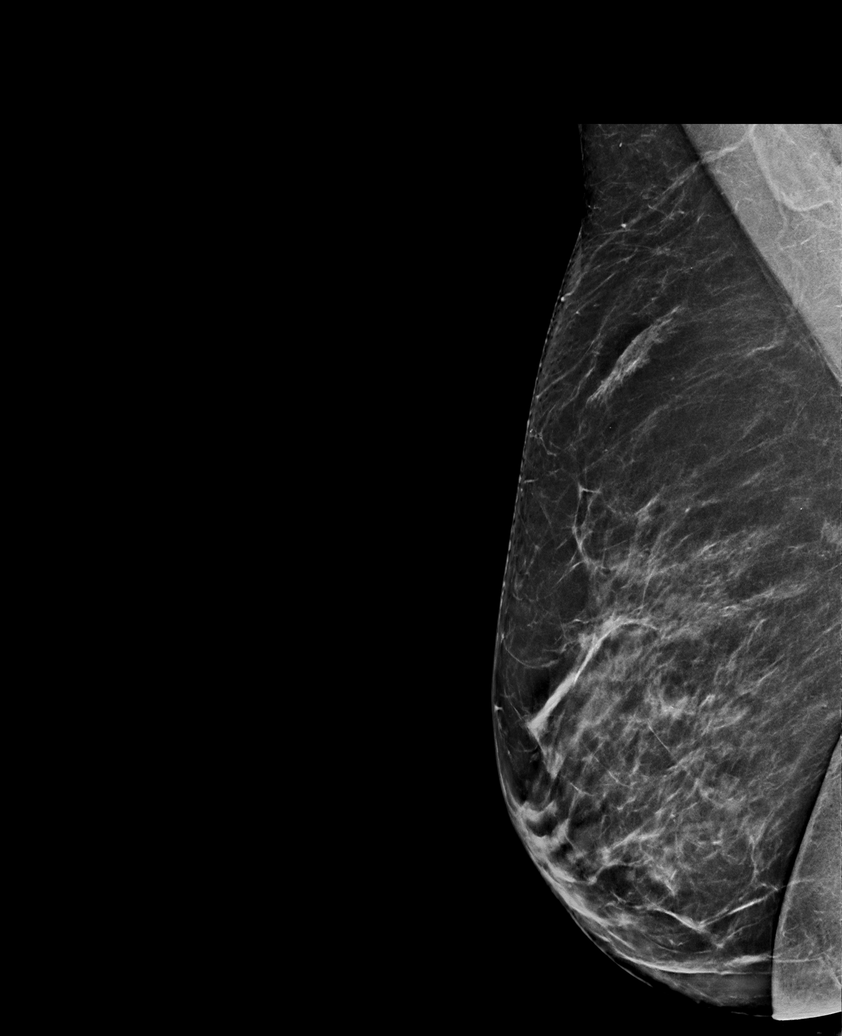

[R CC synth-2D]
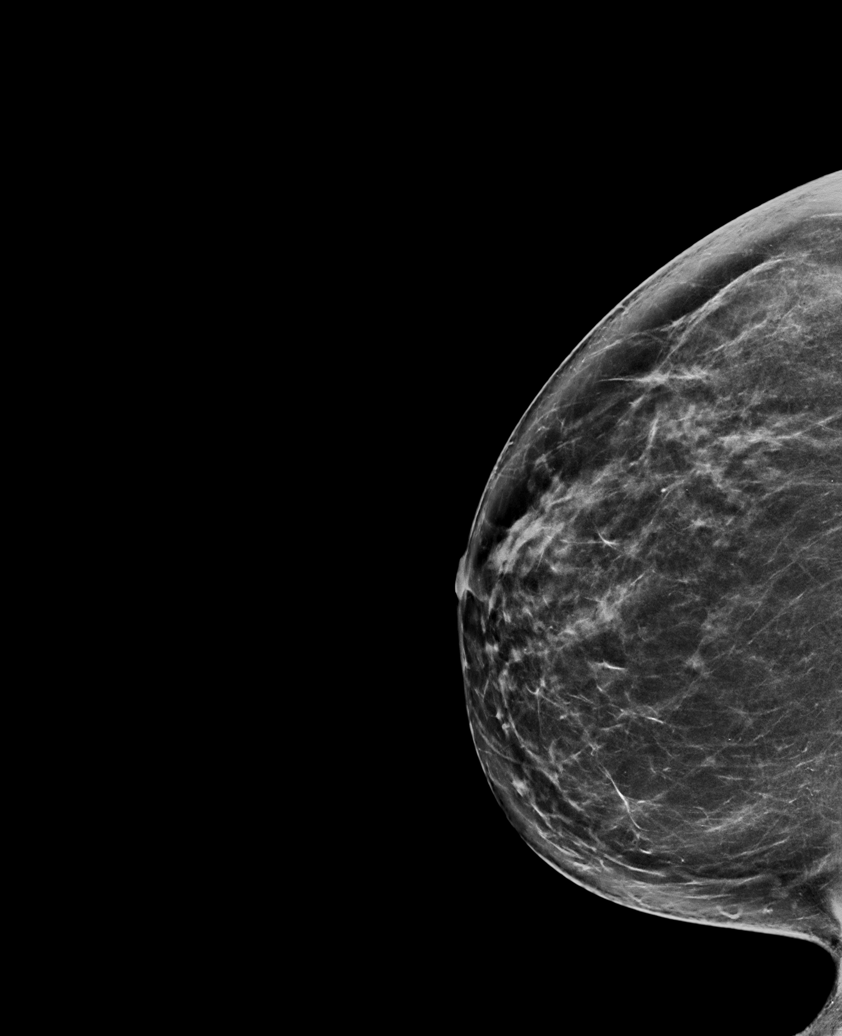

[6 of 36 positions shown; findings below may reference images not displayed]

ACR Breast Density Category b: There are scattered areas of
fibroglandular density.
FINDINGS: There are no findings suspicious for malignancy. Images were
processed with CAD.
IMPRESSION: No mammographic evidence of malignancy. A result letter of this
screening mammogram will be mailed directly to the patient.

RECOMMENDATION:
Screening mammogram in one year. (Code:[id number])

BI-RADS CATEGORY  1: Negative.

## 2021-08-05 ENCOUNTER — Inpatient Hospital Stay: Payer: BC Managed Care – PPO | Attending: Obstetrics and Gynecology | Admitting: Obstetrics and Gynecology

## 2021-08-05 ENCOUNTER — Other Ambulatory Visit: Payer: Self-pay

## 2021-08-05 VITALS — BP 126/74 | HR 72 | Temp 98.7°F | Resp 18 | Wt 161.5 lb

## 2021-08-05 DIAGNOSIS — N905 Atrophy of vulva: Secondary | ICD-10-CM | POA: Diagnosis not present

## 2021-08-05 DIAGNOSIS — N903 Dysplasia of vulva, unspecified: Secondary | ICD-10-CM | POA: Diagnosis not present

## 2021-08-05 DIAGNOSIS — D071 Carcinoma in situ of vulva: Secondary | ICD-10-CM | POA: Insufficient documentation

## 2021-08-05 NOTE — Progress Notes (Signed)
Gynecologic Oncology Interval Visit   Referring Provider: Dr. Dalbert Garnet  Chief Complaint: VIN 3 s/p WLE  Subjective:  Denise Rasmussen is a 65 y.o. G2P2 female who is seen in consultation from Dr. 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   She presents for surveillance and has no complaints. She continues to follow up Dr. Dalbert Garnet for pessary management for grade 2 anterior and apical prolapse.   Gynecologic History:  Denise Rasmussen is a pleasant patient who was seen consultation from Dr. 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 Her history is noted below.  07/01/2020 vulvar nodules-3 small white verrucal lesions of the posterior fourchette -biopsies performed x 3.  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  Uses pessary for symptoms.  She is postmenopausal at age 50.  Denies history of abnormal Pap smears. G2P2    We discussed options for management including surgery versus topical therapy with imiquimod. Given risk of occult malignancy recommend surgery with colposcopy of the vulvar, directed biopsies, and WLE. She opted for WLE.   Underwent WLE with Dr. Sonia Side on 08/27/20.   Pathology:  DIAGNOSIS:  A. VULVA, RIGHT AT 7:00; BIOPSY:  - BENIGN SQUAMOUS MUCOSA.  - NEGATIVE FOR DYSPLASIA AND MALIGNANCY.   B. VULVA, LEFT LABIA MINORA AT 3:00; BIOPSY:  - BENIGN SQUAMOUS MUCOSA.  - NEGATIVE FOR DYSPLASIA AND MALIGNANCY.   C. VULVA, RIGHT LABIA MINORA AT 9:00; BIOPSY:  - BENIGN SQUAMOUS MUCOSA.  - NEGATIVE FOR DYSPLASIA AND MALIGNANCY.   D. VULVA, POSTERIOR MIDLINE; EXCISION:  - HIGH-GRADE SQUAMOUS INTRAEPITHELIAL LESION (VIN 3, SEVERE DYSPLASIA).  - NEGATIVE FOR INVASIVE CARCINOMA.  - ALL SURGICAL MARGINS FREE OF DYSPLASIA  We discussed that no adjuvant treatment would be recommended.  Clinically NED.   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   Pure hypercholesterolemia 10/05/2017   Vaccine counseling 10/05/2017   Essential hypertension 02/04/2017   Moderate episode of recurrent major depressive disorder (HCC) 02/04/2017   Primary osteoarthritis of both hands 02/04/2017   Ganglion cyst 07/01/2016   Hand pain 06/30/2016   HSV infection 05/03/2015   Depression, major, single episode, mild (HCC) 05/03/2015   Arthritis, degenerative 05/03/2015   Post menopausal syndrome 05/03/2015   Borderline diabetes mellitus 05/03/2015   Raynaud's syndrome 05/03/2015   Vitamin D deficiency 05/03/2015   Cervical pain 07/26/2008   Insomnia 01/24/2008   Past Medical History:  Diagnosis Date   Allergy    Anxiety    Arthritis    Breast mass 10/2016   right benign   Depression    Hypertension    Pre-diabetes    diet controlled   Past Surgical History:  Procedure Laterality Date   COLONOSCOPY     COLPOSCOPY N/A 08/27/2020   Procedure: COLPOSCOPY WITH BIOPSIES;  Surgeon: Artelia Laroche, MD;  Location: [facility];  Service: Gynecology;  Laterality: N/A;   vaginal delivery     x2   VULVECTOMY N/A 08/27/2020   Procedure: WIDE EXCISION VULVECTOMY;  Surgeon: Artelia Laroche, MD;  Location: [facility];  Service: Gynecology;  Laterality: N/A;   Past Gynecologic History:  Menarche: 14 History of Abnormal pap: No Last pap: as per HPI Sexually active: yes  OB History   No obstetric history on file.    Family History  Problem Relation Age of Onset   Breast cancer Maternal Grandmother    Hypertension Mother    Diabetes Mother    Heart disease Mother    Social History   Tobacco Use   Smoking status: Never   Smokeless tobacco: Never  Vaping Use   Vaping Use: Never used  Substance Use Topics   Alcohol use: Yes    Alcohol/week: 0.0 standard drinks    Comment: occasionally   Drug use: No   Immunization History  Administered Date(s) Administered    Influenza Split 07/16/2009   Influenza,inj,Quad PF,6+ Mos 08/15/2019   Influenza,inj,quad, With Preservative 07/07/2016   Influenza-Unspecified 07/25/2014, 07/06/2016, 07/05/2017, 08/02/2018, 07/16/2020   PFIZER Comirnaty(Gray Top)Covid-19 Tri-Sucrose Vaccine 12/08/2019, 12/29/2019, 08/11/2020   PFIZER(Purple Top)SARS-COV-2 Vaccination 12/08/2019, 12/29/2019   Tdap 11/20/2010    Allergies:  Patient has no known allergies.  Current Outpatient Medications on File Prior to Visit  Medication Sig Dispense Refill   buPROPion (WELLBUTRIN SR) 100 MG 12 hr tablet Take 50 mg by mouth daily.      Cholecalciferol (VITAMIN D-3) 125 MCG (5000 UT) TABS Take 10,000 Units by mouth daily.     DULoxetine (CYMBALTA) 60 MG capsule Take 60 mg by mouth every morning.     estradiol (ESTRACE) 0.1 MG/GM vaginal cream Place 1 Applicatorful vaginally daily as needed (dryness). 42.5 g 2   lamoTRIgine (LAMICTAL) 200 MG tablet Take 200 mg by mouth daily.   1   lisinopril (PRINIVIL,ZESTRIL) 10 MG tablet TAKE 1 TABLET(10 MG) BY MOUTH DAILY (Patient taking differently: Take 10 mg by mouth daily.) 30 tablet 0   metroNIDAZOLE (METROGEL) 0.75 % vaginal gel Place 1 Applicatorful vaginally daily as needed (irritation).     valACYclovir (VALTREX) 1000 MG tablet Take 1,000 mg by mouth daily.     acetaminophen (TYLENOL) 500 MG tablet Take 1,000 mg by mouth every 6 (six) hours as needed for moderate pain or headache.  (Patient not taking: No sig reported)     ALPRAZolam (XANAX) 0.5 MG tablet TAKE 1 TABLET BY MOUTH AS NEEDED FOR ANXIETY (Patient not taking: No sig reported) 30 tablet 0   traZODone (DESYREL) 50 MG tablet Take 25-50 mg by mouth at bedtime as needed for sleep.  (Patient not taking: No sig reported)     No current facility-administered medications on file prior to visit.    Review of Systems General: no complaints  HEENT: no complaints  Lungs: no complaints  Cardiac: no complaints  GI: no complaints  GU: no  complaints  Musculoskeletal: no complaints  Extremities: no complaints  Skin: no complaints  Neuro: no complaints  Endocrine: no complaints  Psych: no complaints        Objective:  Physical Examination:  Today's Vitals   08/05/21 1446  BP: 126/74  Pulse: 72  Resp: 18  Temp: 98.7 F (37.1 C)  TempSrc: Tympanic  SpO2: 99%  Weight: 161 lb 8 oz (73.3 kg)  PainSc: 0-No pain   Body mass index is 25.29 kg/m.   Performance Status: 0  GENERAL: Patient is a well appearing female in no acute distress HEENT:  atraumatic normocephalic ABDOMEN:  Soft, nontender, nondistended.   EXTREMITIES:  No peripheral edema.   NEURO:  Nonfocal. Well oriented.  Appropriate affect.  Pelvic: EGBUS: no lesions; scarring at the perineum from prior surgery. No nodularity appreciated Cervix: no lesions, nontender, mobile Vagina: no lesions, no discharge or bleeding Uterus: normal size, nontender, mobile Adnexa: no palpable masses   Lab Review No labs  on site today   Radiologic Imaging: No imaging on site today    Assessment:  Denise Rasmussen is a 65 y.o. 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 Pathology positive for VIN3 with negative margins. Wound separation, healed by secondary intention. No evidence of recurrence.  Vulvar atrophy  Medical co-morbidities complicating care: HTN and borderline diabetes.   Plan:   Vulvar dysplasia  Contiue close follow up every 6 months for 1 year. She will continue to alternative visits with Dr. Dalbert Garnet. She can be released in 08/2022 if she is NED. Precautions were given for symptoms of recurrence.    Mathew Postiglione Leta Jungling, MD

## 2022-02-03 ENCOUNTER — Inpatient Hospital Stay: Payer: BC Managed Care – PPO | Attending: Obstetrics and Gynecology | Admitting: Obstetrics and Gynecology

## 2022-02-03 VITALS — BP 122/67 | HR 76 | Temp 98.2°F | Resp 16 | Ht 67.0 in | Wt 165.0 lb

## 2022-02-03 DIAGNOSIS — R7303 Prediabetes: Secondary | ICD-10-CM | POA: Diagnosis not present

## 2022-02-03 DIAGNOSIS — D071 Carcinoma in situ of vulva: Secondary | ICD-10-CM | POA: Diagnosis present

## 2022-02-03 DIAGNOSIS — Z79899 Other long term (current) drug therapy: Secondary | ICD-10-CM | POA: Insufficient documentation

## 2022-02-03 DIAGNOSIS — I10 Essential (primary) hypertension: Secondary | ICD-10-CM | POA: Diagnosis not present

## 2022-02-03 NOTE — Progress Notes (Signed)
Gynecologic Oncology Interval Visit  ? ?Referring Provider: Dr. Leafy Ro ? ?Chief Complaint: VIN 3 s/p WLE ? ?Subjective:  ?Denise Rasmussen is a 66 y.o. G2P2 female who is seen in consultation from Dr. 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  ? ?She presents for surveillance and has no complaints. She continues to follow up Dr. Leafy Ro for pessary management for grade 2 anterior and apical prolapse. Hasn't seen Dr. Leafy Ro since November 2021. Is overdue for mammogram. Complains of seasonal allergies but otherwise feels well.  ? ?Gynecologic History:  ?Denise Rasmussen is a pleasant patient who was seen consultation from Dr. 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 Her history is noted below. ? ?07/01/2020 vulvar nodules-3 small white verrucal lesions of the posterior fourchette -biopsies performed x 3.  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 ?Pap-  ?07/31/2020 - NILM, HR HPV Negative ?09/2016 - neg/neg ?11/2012- neg/neg ? 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  Uses pessary for symptoms.  She is postmenopausal at age 64.  Denies history of abnormal Pap smears. G2P2 ? ? ? ?We discussed options for management including surgery versus topical therapy with imiquimod. Given risk of occult malignancy recommend surgery with colposcopy of the vulvar, directed biopsies, and WLE. She opted for WLE.  ? ?Underwent WLE with Dr. Theora Gianotti on 08/27/20.  ? ?Pathology:  ?DIAGNOSIS:  ?A. VULVA, RIGHT AT 7:00; BIOPSY:  ?- BENIGN SQUAMOUS MUCOSA.  ?- NEGATIVE FOR DYSPLASIA AND MALIGNANCY.  ? ?B. VULVA, LEFT LABIA MINORA AT 3:00; BIOPSY:  ?- BENIGN SQUAMOUS MUCOSA.  ?- NEGATIVE FOR DYSPLASIA AND MALIGNANCY.  ? ?C. VULVA, RIGHT LABIA MINORA AT 9:00; BIOPSY:  ?- BENIGN SQUAMOUS MUCOSA.  ?- NEGATIVE FOR DYSPLASIA AND MALIGNANCY.  ? ?D. VULVA, POSTERIOR MIDLINE; EXCISION:  ?- HIGH-GRADE SQUAMOUS INTRAEPITHELIAL LESION (VIN 3, SEVERE DYSPLASIA).  ?- NEGATIVE FOR INVASIVE CARCINOMA.  ?- ALL SURGICAL MARGINS  FREE OF DYSPLASIA ? ?We discussed that no adjuvant treatment would be recommended. ? ?Clinically NED.  ? ?Patient Active Problem List  ? Diagnosis Date Noted  ? 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 Pure hypercholesterolemia 10/05/2017  ? Vaccine counseling 10/05/2017  ? Essential hypertension 02/04/2017  ? Moderate episode of recurrent major depressive disorder ([place]) 02/04/2017  ? Primary osteoarthritis of both hands 02/04/2017  ? Ganglion cyst 07/01/2016  ? Hand pain 06/30/2016  ? HSV infection 05/03/2015  ? Depression, major, single episode, mild ([place]) 05/03/2015  ? Arthritis, degenerative 05/03/2015  ? Post menopausal syndrome 05/03/2015  ? Borderline diabetes mellitus 05/03/2015  ? Raynaud's syndrome 05/03/2015  ? Vitamin D deficiency 05/03/2015  ? Cervical pain 07/26/2008  ? Insomnia 01/24/2008  ? ?Past Medical History:  ?Diagnosis Date  ? Allergy   ? Anxiety   ? Arthritis   ? Breast mass 10/2016  ? right benign  ? Depression   ? Hypertension   ? Pre-diabetes   ? diet controlled  ? ?Past Surgical History:  ?Procedure Laterality Date  ? COLONOSCOPY    ? COLPOSCOPY N/A 08/27/2020  ? Procedure: COLPOSCOPY WITH BIOPSIES;  Surgeon: Gillis Ends, MD;  Location: [facility];  Service: Gynecology;  Laterality: N/A;  ? vaginal delivery    ? x2  ? VULVECTOMY N/A 08/27/2020  ? Procedure: WIDE EXCISION VULVECTOMY;  Surgeon: Gillis Ends, MD;  Location: [facility];  Service: Gynecology;  Laterality: N/A;  ? ?Past Gynecologic History:  ?Menarche: 14 ?History of Abnormal pap: No ?Last pap: as per HPI ?Sexually active:  yes ? ?OB History   ?No obstetric history on file. ?  ? ?Family History  ?Problem Relation Age of Onset  ? Breast cancer Maternal Grandmother   ? Hypertension Mother   ? Diabetes Mother   ? Heart disease Mother   ? ?Social History  ? ?Tobacco Use  ? Smoking status: Never  ? Smokeless tobacco: Never  ?Vaping Use  ? Vaping Use: Never used  ?Substance Use Topics  ? Alcohol use: Yes  ?  Alcohol/week: 0.0  standard drinks  ?  Comment: occasionally  ? Drug use: No  ? ?Immunization History  ?Administered Date(s) Administered  ? Influenza Split 07/16/2009  ? Influenza,inj,Quad PF,6+ Mos 08/15/2019  ? Influenza,inj,quad, With Preservative 07/07/2016  ? Influenza-Unspecified 07/25/2014, 07/06/2016, 07/05/2017, 08/02/2018, 07/16/2020  ? PFIZER Comirnaty(Gray Top)Covid-19 Tri-Sucrose Vaccine 12/08/2019, 12/29/2019, 08/11/2020  ? PFIZER(Purple Top)SARS-COV-2 Vaccination 12/08/2019, 12/29/2019  ? Tdap 11/20/2010  ? ? ?Allergies:  ?Patient has no known allergies. ? ?Current Outpatient Medications on File Prior to Visit  ?Medication Sig Dispense Refill  ? acetaminophen (TYLENOL) 500 MG tablet Take 1,000 mg by mouth every 6 (six) hours as needed for moderate pain or headache.    ? buPROPion (WELLBUTRIN SR) 100 MG 12 hr tablet Take 50 mg by mouth daily.     ? Cholecalciferol (VITAMIN D-3) 125 MCG (5000 UT) TABS Take 10,000 Units by mouth daily.    ? DULoxetine (CYMBALTA) 60 MG capsule Take 60 mg by mouth every morning.    ? estradiol (ESTRACE) 0.1 MG/GM vaginal cream Place 1 Applicatorful vaginally daily as needed (dryness). 42.5 g 2  ? lamoTRIgine (LAMICTAL) 200 MG tablet Take 200 mg by mouth daily.   1  ? lisinopril (PRINIVIL,ZESTRIL) 10 MG tablet TAKE 1 TABLET(10 MG) BY MOUTH DAILY (Patient taking differently: Take 10 mg by mouth daily.) 30 tablet 0  ? metroNIDAZOLE (METROGEL) 0.75 % vaginal gel Place 1 Applicatorful vaginally daily as needed (irritation).    ? Omega-3 Fatty Acids (FISH OIL) 1000 MG CAPS Take by mouth.    ? valACYclovir (VALTREX) 1000 MG tablet Take 1,000 mg by mouth daily.    ? ALPRAZolam (XANAX) 0.5 MG tablet TAKE 1 TABLET BY MOUTH AS NEEDED FOR ANXIETY (Patient not taking: Reported on 02/04/2021) 30 tablet 0  ? traZODone (DESYREL) 50 MG tablet Take 25-50 mg by mouth at bedtime as needed for sleep.  (Patient not taking: Reported on 02/04/2021)    ? ?No current facility-administered medications on file prior to  visit.  ? ? ?Review of Systems ?  General: no complaints  ?HEENT: seasonal allergies  ?Lungs: no complaints  ?Cardiac: no complaints  ?GI: no complaints  ?GU: no complaints  ?Musculoskeletal: no complaints  ?Extremities: no complaints  ?Skin: no complaints  ?Neuro: no complaints  ?Endocrine: no complaints  ?Psych: no complaints  ?  ? ?Objective:  ?Physical Examination:  ?Today's Vitals  ? 02/03/22 1441 02/03/22 1449  ?BP: 122/67   ?Pulse: 76   ?Resp: 16   ?Temp: 98.2 ?F (36.8 ?C)   ?TempSrc: Tympanic   ?SpO2: 99%   ?Weight: 165 lb (74.8 kg)   ?Height: 5\' 7"  (1.702 m)   ?PainSc: 0-No pain 0-No pain  ? ?Body mass index is 25.84 kg/m?.  ? ?GENERAL: Patient is a well appearing female in no acute distress ?HEENT:  Atraumatic and normocephalic. PERRL, neck supple. ?NODES:  No cervical, supraclavicular, axillary, or inguinal lymphadenopathy palpated.  ?LUNGS:  Clear to auscultation bilaterally.  No wheezes. ?HEART:  Regular rate  and rhythm.  ?ABDOMEN:  Soft, nontender. Nondistended.  ?EXTREMITIES:  No peripheral edema.   ?SKIN:  Clear with no obvious rashes or skin changes.  ?NEURO:  Nonfocal. Well oriented.  Appropriate affect. ? ?Pelvic: Exam chaperoned by CMA ?EGBUS: no lesions ?Cervix: normal cervix. Difficult to see due to prolapse.  ?Vagina: no lesions, no discharge or bleeding ?Uterus: not enlarged.  ?BME: no palpable masses ?Rectovaginal: deferred  ? ?Lab Review ?No labs on site today ? ? ?Radiologic Imaging: ?No imaging on site today ?   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 Pathology positive for VIN3 with negative margins. Wound separation, healed by secondary intention. No evidence of recurrence. ? ?Vulvar atrophy ? ?Medical co-morbidities complicating care: HTN and borderline diabetes.  ? ?Plan:  ? ?VIN III (vulvar intraepithelial neoplasia III) ? ?Contiue close follow up every 6-7 months with next appointment in November 2023. She will continue to alternative visits  with Dr. Leafy Ro. She can be released in 08/2022 if she is NED. Precautions were given for symptoms of recurrence.  ? ?Beckey Rutter, DNP, AGNP-C ?[place] [facility] ?[phone] (clinic) ? ?

## 2022-05-03 ENCOUNTER — Ambulatory Visit: Payer: BC Managed Care – PPO | Admitting: Medical

## 2022-05-03 DIAGNOSIS — U071 COVID-19: Secondary | ICD-10-CM

## 2022-05-03 NOTE — Progress Notes (Signed)
Test home last Friday and Saturday. For Covid both postiive.  Symptoms fatigue, no fever or chills because she has been taking Tylenol and sudafed. Thinks she started on Tuesday with symptoms thought it was plane fatigue from traveling.  She is to stay out of work till fever is resolved x 24 hour and no OTC Motrin or Tylenol being taken.  Patient verbalizes understanding and has no questions at discharge.  She is to have an appointment on Wednesday for reassessment. Patient verbalizes understanding and has no questions at discharge.

## 2022-05-05 ENCOUNTER — Ambulatory Visit: Payer: BC Managed Care – PPO | Admitting: Nurse Practitioner

## 2022-05-05 DIAGNOSIS — U071 COVID-19: Secondary | ICD-10-CM

## 2022-05-05 NOTE — Progress Notes (Signed)
Phone call follow up with patient for COVID-19  Took home test on 04/30/22 and 05/01/22 That were both positive  Prior symptoms included fatigue mainly   Managed symptoms with over the counter medications   Took a home test yesterday that was negative.   Improving today overall-is working from home   Follow up as needed

## 2022-05-12 ENCOUNTER — Encounter: Payer: Self-pay | Admitting: Medical

## 2022-05-12 NOTE — Patient Instructions (Signed)
COVID-19: Quarantine and Isolation °Quarantine °If you were exposed °Quarantine and stay away from others when you have been in close contact with someone who has COVID-19. °Isolate °If you are sick or test positive °Isolate when you are sick or when you have COVID-19, even if you don't have symptoms. °When to stay home °Calculating quarantine °The date of your exposure is considered day 0. Day 1 is the first full day after your last contact with a person who has had COVID-19. Stay home and away from other people for at least 5 days. Learn why CDC updated guidance for the general public. °IF YOU were exposed to COVID-19 and are NOT  °up to dateIF YOU were exposed to COVID-19 and are NOT on COVID-19 vaccinations °Quarantine for at least 5 days °Stay home °Stay home and quarantine for at least 5 full days. °Wear a well-fitting mask if you must be around others in your home. °Do not travel. °Get tested °Even if you don't develop symptoms, get tested at least 5 days after you last had close contact with someone with COVID-19. °After quarantine °Watch for symptoms °Watch for symptoms until 10 days after you last had close contact with someone with COVID-19. °Avoid travel °It is best to avoid travel until a full 10 days after you last had close contact with someone with COVID-19. °If you develop symptoms °Isolate immediately and get tested. Continue to stay home until you know the results. Wear a well-fitting mask around others. °Take precautions until day 10 °Wear a well-fitting mask °Wear a well-fitting mask for 10 full days any time you are around others inside your home or in public. Do not go to places where you are unable to wear a well-fitting mask. °If you must travel during days 6-10, take precautions. °Avoid being around people who are more likely to get very sick from COVID-19. °IF YOU were exposed to COVID-19 and are  °up to dateIF YOU were exposed to COVID-19 and are on COVID-19 vaccinations °No  quarantine °You do not need to stay home unless you develop symptoms. °Get tested °Even if you don't develop symptoms, get tested at least 5 days after you last had close contact with someone with COVID-19. °Watch for symptoms °Watch for symptoms until 10 days after you last had close contact with someone with COVID-19. °If you develop symptoms °Isolate immediately and get tested. Continue to stay home until you know the results. Wear a well-fitting mask around others. °Take precautions until day 10 °Wear a well-fitting mask °Wear a well-fitting mask for 10 full days any time you are around others inside your home or in public. Do not go to places where you are unable to wear a well-fitting mask. °Take precautions if traveling °Avoid being around people who are more likely to get very sick from COVID-19. °IF YOU were exposed to COVID-19 and had confirmed COVID-19 within the past 90 days (you tested positive using a viral test) °No quarantine °You do not need to stay home unless you develop symptoms. °Watch for symptoms °Watch for symptoms until 10 days after you last had close contact with someone with COVID-19. °If you develop symptoms °Isolate immediately and get tested. Continue to stay home until you know the results. Wear a well-fitting mask around others. °Take precautions until day 10 °Wear a well-fitting mask °Wear a well-fitting mask for 10 full days any time you are around others inside your home or in public. Do not go to places where you are   unable to wear a well-fitting mask. °Take precautions if traveling °Avoid being around people who are more likely to get very sick from COVID-19. °Calculating isolation °Day 0 is your first day of symptoms or a positive viral test. Day 1 is the first full day after your symptoms developed or your test specimen was collected. If you have COVID-19 or have symptoms, isolate for at least 5 days. °IF YOU tested positive for COVID-19 or have symptoms, regardless of  vaccination status °Stay home for at least 5 days °Stay home for 5 days and isolate from others in your home. °Wear a well-fitting mask if you must be around others in your home. °Do not travel. °Ending isolation if you had symptoms °End isolation after 5 full days if you are fever-free for 24 hours (without the use of fever-reducing medication) and your symptoms are improving. °Ending isolation if you did NOT have symptoms °End isolation after at least 5 full days after your positive test. °If you got very sick from COVID-19 or have a weakened immune system °You should isolate for at least 10 days. Consult your doctor before ending isolation. °Take precautions until day 10 °Wear a well-fitting mask °Wear a well-fitting mask for 10 full days any time you are around others inside your home or in public. Do not go to places where you are unable to wear a well-fitting mask. °Do not travel °Do not travel until a full 10 days after your symptoms started or the date your positive test was taken if you had no symptoms. °Avoid being around people who are more likely to get very sick from COVID-19. °Definitions °Exposure °Contact with someone infected with SARS-CoV-2, the virus that causes COVID-19, in a way that increases the likelihood of getting infected with the virus. °Close contact °A close contact is someone who was less than 6 feet away from an infected person (laboratory-confirmed or a clinical diagnosis) for a cumulative total of 15 minutes or more over a 24-hour period. For example, three individual 5-minute exposures for a total of 15 minutes. People who are exposed to someone with COVID-19 after they completed at least 5 days of isolation are not considered close contacts. °Quarantine °Quarantine is a strategy used to prevent transmission of COVID-19 by keeping people who have been in close contact with someone with COVID-19 apart from others. °Who does not need to quarantine? °If you had close contact with  someone with COVID-19 and you are in one of the following groups, you do not need to quarantine. °You are up to date with your COVID-19 vaccines. °You had confirmed COVID-19 within the last 90 days (meaning you tested positive using a viral test). °If you are up to date with COVID-19 vaccines, you should wear a well-fitting mask around others for 10 days from the date of your last close contact with someone with COVID-19 (the date of last close contact is considered day 0). Get tested at least 5 days after you last had close contact with someone with COVID-19. If you test positive or develop COVID-19 symptoms, isolate from other people and follow recommendations in the Isolation section below. If you tested positive for COVID-19 with a viral test within the previous 90 days and subsequently recovered and remain without COVID-19 symptoms, you do not need to quarantine or get tested after close contact. You should wear a well-fitting mask around others for 10 days from the date of your last close contact with someone with COVID-19 (the date of last   close contact is considered day 0). If you have COVID-19 symptoms, get tested and isolate from other people and follow recommendations in the Isolation section below. °Who should quarantine? °If you come into close contact with someone with COVID-19, you should quarantine if you are not up to date on COVID-19 vaccines. This includes people who are not vaccinated. °What to do for quarantine °Stay home and away from other people for at least 5 days (day 0 through day 5) after your last contact with a person who has COVID-19. The date of your exposure is considered day 0. Wear a well-fitting mask when around others at home, if possible. °For 10 days after your last close contact with someone with COVID-19, watch for fever (100.4°F or greater), cough, shortness of breath, or other COVID-19 symptoms. °If you develop symptoms, get tested immediately and isolate until you receive  your test results. If you test positive, follow isolation recommendations. °If you do not develop symptoms, get tested at least 5 days after you last had close contact with someone with COVID-19. °If you test negative, you can leave your home, but continue to wear a well-fitting mask when around others at home and in public until 10 days after your last close contact with someone with COVID-19. °If you test positive, you should isolate for at least 5 days from the date of your positive test (if you do not have symptoms). If you do develop COVID-19 symptoms, isolate for at least 5 days from the date your symptoms began (the date the symptoms started is day 0). Follow recommendations in the isolation section below. °If you are unable to get a test 5 days after last close contact with someone with COVID-19, you can leave your home after day 5 if you have been without COVID-19 symptoms throughout the 5-day period. Wear a well-fitting mask for 10 days after your date of last close contact when around others at home and in public. °Avoid people who are have weakened immune systems or are more likely to get very sick from COVID-19, and nursing homes and other high-risk settings, until after at least 10 days. °If possible, stay away from people you live with, especially people who are at higher risk for getting very sick from COVID-19, as well as others outside your home throughout the full 10 days after your last close contact with someone with COVID-19. °If you are unable to quarantine, you should wear a well-fitting mask for 10 days when around others at home and in public. °If you are unable to wear a mask when around others, you should continue to quarantine for 10 days. Avoid people who have weakened immune systems or are more likely to get very sick from COVID-19, and nursing homes and other high-risk settings, until after at least 10 days. °See additional information about travel. °Do not go to places where you are  unable to wear a mask, such as restaurants and some gyms, and avoid eating around others at home and at work until after 10 days after your last close contact with someone with COVID-19. °After quarantine °Watch for symptoms until 10 days after your last close contact with someone with COVID-19. °If you have symptoms, isolate immediately and get tested. °Quarantine in high-risk congregate settings °In certain congregate settings that have high risk of secondary transmission (such as correctional and detention facilities, homeless shelters, or cruise ships), CDC recommends a 10-day quarantine for residents, regardless of vaccination and booster status. During periods of critical staffing   shortages, facilities may consider shortening the quarantine period for staff to ensure continuity of operations. Decisions to shorten quarantine in these settings should be made in consultation with state, local, tribal, or territorial health departments and should take into consideration the context and characteristics of the facility. CDC's setting-specific guidance provides additional recommendations for these settings. °Isolation °Isolation is used to separate people with confirmed or suspected COVID-19 from those without COVID-19. People who are in isolation should stay home until it's safe for them to be around others. At home, anyone sick or infected should separate from others, or wear a well-fitting mask when they need to be around others. People in isolation should stay in a specific "sick room" or area and use a separate bathroom if available. Everyone who has presumed or confirmed COVID-19 should stay home and isolate from other people for at least 5 full days (day 0 is the first day of symptoms or the date of the day of the positive viral test for asymptomatic persons). They should wear a mask when around others at home and in public for an additional 5 days. People who are confirmed to have COVID-19 or are showing  symptoms of COVID-19 need to isolate regardless of their vaccination status. This includes: °People who have a positive viral test for COVID-19, regardless of whether or not they have symptoms. °People with symptoms of COVID-19, including people who are awaiting test results or have not been tested. People with symptoms should isolate even if they do not know if they have been in close contact with someone with COVID-19. °What to do for isolation °Monitor your symptoms. If you have an emergency warning sign (including trouble breathing), seek emergency medical care immediately. °Stay in a separate room from other household members, if possible. °Use a separate bathroom, if possible. °Take steps to improve ventilation at home, if possible. °Avoid contact with other members of the household and pets. °Don't share personal household items, like cups, towels, and utensils. °Wear a well-fitting mask when you need to be around other people. °Learn more about what to do if you are sick and how to notify your contacts. °Ending isolation for people who had COVID-19 and had symptoms °If you had COVID-19 and had symptoms, isolate for at least 5 days. To calculate your 5-day isolation period, day 0 is your first day of symptoms. Day 1 is the first full day after your symptoms developed. You can leave isolation after 5 full days. °You can end isolation after 5 full days if you are fever-free for 24 hours without the use of fever-reducing medication and your other symptoms have improved (Loss of taste and smell may persist for weeks or months after recovery and need not delay the end of isolation). °You should continue to wear a well-fitting mask around others at home and in public for 5 additional days (day 6 through day 10) after the end of your 5-day isolation period. If you are unable to wear a mask when around others, you should continue to isolate for a full 10 days. Avoid people who have weakened immune systems or are more  likely to get very sick from COVID-19, and nursing homes and other high-risk settings, until after at least 10 days. °If you continue to have fever or your other symptoms have not improved after 5 days of isolation, you should wait to end your isolation until you are fever-free for 24 hours without the use of fever-reducing medication and your other symptoms have improved.   Continue to wear a well-fitting mask through day 10. Contact your healthcare provider if you have questions. °See additional information about travel. °Do not go to places where you are unable to wear a mask, such as restaurants and some gyms, and avoid eating around others at home and at work until a full 10 days after your first day of symptoms. °If an individual has access to a test and wants to test, the best approach is to use an antigen test1 towards the end of the 5-day isolation period. Collect the test sample only if you are fever-free for 24 hours without the use of fever-reducing medication and your other symptoms have improved (loss of taste and smell may persist for weeks or months after recovery and need not delay the end of isolation). If your test result is positive, you should continue to isolate until day 10. If your test result is negative, you can end isolation, but continue to wear a well-fitting mask around others at home and in public until day 10. Follow additional recommendations for masking and avoiding travel as described above. °1As noted in the labeling for authorized over-the counter antigen tests: Negative results should be treated as presumptive. Negative results do not rule out SARS-CoV-2 infection and should not be used as the sole basis for treatment or patient management decisions, including infection control decisions. To improve results, antigen tests should be used twice over a three-day period with at least 24 hours and no more than 48 hours between tests. °Note that these recommendations on ending isolation  do not apply to people who are moderately ill or very sick from COVID-19 or have weakened immune systems. See section below for recommendations for when to end isolation for these groups. °Ending isolation for people who tested positive for COVID-19 but had no symptoms °If you test positive for COVID-19 and never develop symptoms, isolate for at least 5 days. Day 0 is the day of your positive viral test (based on the date you were tested) and day 1 is the first full day after the specimen was collected for your positive test. You can leave isolation after 5 full days. °If you continue to have no symptoms, you can end isolation after at least 5 days. °You should continue to wear a well-fitting mask around others at home and in public until day 10 (day 6 through day 10). If you are unable to wear a mask when around others, you should continue to isolate for 10 days. Avoid people who have weakened immune systems or are more likely to get very sick from COVID-19, and nursing homes and other high-risk settings, until after at least 10 days. °If you develop symptoms after testing positive, your 5-day isolation period should start over. Day 0 is your first day of symptoms. Follow the recommendations above for ending isolation for people who had COVID-19 and had symptoms. °See additional information about travel. °Do not go to places where you are unable to wear a mask, such as restaurants and some gyms, and avoid eating around others at home and at work until 10 days after the day of your positive test. °If an individual has access to a test and wants to test, the best approach is to use an antigen test1 towards the end of the 5-day isolation period. If your test result is positive, you should continue to isolate until day 10. If your test result is positive, you can also choose to test daily and if your test result   is negative, you can end isolation, but continue to wear a well-fitting mask around others at home and in  public until day 10. Follow additional recommendations for masking and avoiding travel as described above. °1As noted in the labeling for authorized over-the counter antigen tests: Negative results should be treated as presumptive. Negative results do not rule out SARS-CoV-2 infection and should not be used as the sole basis for treatment or patient management decisions, including infection control decisions. To improve results, antigen tests should be used twice over a three-day period with at least 24 hours and no more than 48 hours between tests. °Ending isolation for people who were moderately or very sick from COVID-19 or have a weakened immune system °People who are moderately ill from COVID-19 (experiencing symptoms that affect the lungs like shortness of breath or difficulty breathing) should isolate for 10 days and follow all other isolation precautions. To calculate your 10-day isolation period, day 0 is your first day of symptoms. Day 1 is the first full day after your symptoms developed. If you are unsure if your symptoms are moderate, talk to a healthcare provider for further guidance. °People who are very sick from COVID-19 (this means people who were hospitalized or required intensive care or ventilation support) and people who have weakened immune systems might need to isolate at home longer. They may also require testing with a viral test to determine when they can be around others. CDC recommends an isolation period of at least 10 and up to 20 days for people who were very sick from COVID-19 and for people with weakened immune systems. Consult with your healthcare provider about when you can resume being around other people. If you are unsure if your symptoms are severe or if you have a weakened immune system, talk to a healthcare provider for further guidance. °People who have a weakened immune system should talk to their healthcare provider about the potential for reduced immune responses to  COVID-19 vaccines and the need to continue to follow current prevention measures (including wearing a well-fitting mask and avoiding crowds and poorly ventilated indoor spaces) to protect themselves against COVID-19 until advised otherwise by their healthcare provider. Close contacts of immunocompromised people--including household members--should also be encouraged to receive all recommended COVID-19 vaccine doses to help protect these people. °Isolation in high-risk congregate settings °In certain high-risk congregate settings that have high risk of secondary transmission and where it is not feasible to cohort people (such as correctional and detention facilities, homeless shelters, and cruise ships), CDC recommends a 10-day isolation period for residents. During periods of critical staffing shortages, facilities may consider shortening the isolation period for staff to ensure continuity of operations. Decisions to shorten isolation in these settings should be made in consultation with state, local, tribal, or territorial health departments and should take into consideration the context and characteristics of the facility. CDC's setting-specific guidance provides additional recommendations for these settings. °This CDC guidance is meant to supplement--not replace--any federal, state, local, territorial, or tribal health and safety laws, rules, and regulations. °Recommendations for specific settings °These recommendations do not apply to healthcare professionals. For guidance specific to these settings, see °Healthcare professionals: Interim Guidance for Managing Healthcare Personnel with SARS-CoV-2 Infection or Exposure to SARS-CoV-2 °Patients, residents, and visitors to healthcare settings: Interim Infection Prevention and Control Recommendations for Healthcare Personnel During the Coronavirus Disease 2019 (COVID-19) Pandemic °Additional setting-specific guidance and recommendations are available. °These  recommendations on quarantine and isolation do apply to K[employer]12 School   settings. Additional guidance is available here: Overview of COVID-19 Quarantine for K-12 Schools °Travelers: Travel information and recommendations °Congregate facilities and other settings: guidance pages for community, work, and school settings °Ongoing COVID-19 exposure FAQs °I live with someone with COVID-19, but I cannot be separated from them. How do we manage quarantine in this situation? °It is very important for people with COVID-19 to remain apart from other people, if possible, even if they are living together. If separation of the person with COVID-19 from others that they live with is not possible, the other people that they live with will have ongoing exposure, meaning they will be repeatedly exposed until that person is no longer able to spread the virus to other people. In this situation, there are precautions you can take to limit the spread of COVID-19: °The person with COVID-19 and everyone they live with should wear a well-fitting mask inside the home. °If possible, one person should care for the person with COVID-19 to limit the number of people who are in close contact with the infected person. °Take steps to protect yourself and others to reduce transmission in the home: °Quarantine if you are not up to date with your COVID-19 vaccines. °Isolate if you are sick or tested positive for COVID-19, even if you don't have symptoms. °Learn more about the public health recommendations for testing, mask use and quarantine of close contacts, like yourself, who have ongoing exposure. These recommendations differ depending on your vaccination status. °What should I do if I have ongoing exposure to COVID-19 from someone I live with? °Recommendations for this situation depend on your vaccination status: °If you are not up to date on COVID-19 vaccines and have ongoing exposure to COVID-19, you should: °Begin quarantine immediately and  continue to quarantine throughout the isolation period of the person with COVID-19. °Continue to quarantine for an additional 5 days starting the day after the end of isolation for the person with COVID-19. °Get tested at least 5 days after the end of isolation of the infected person that lives with them. °If you test negative, you can leave the home but should continue to wear a well-fitting mask when around others at home and in public until 10 days after the end of isolation for the person with COVID-19. °Isolate immediately if you develop symptoms of COVID-19 or test positive. °If you are up to date with COVID-19 vaccines and have ongoing exposure to COVID-19, you should: °Get tested at least 5 days after your first exposure. A person with COVID-19 is considered infectious starting 2 days before they develop symptoms, or 2 days before the date of their positive test if they do not have symptoms. °Get tested again at least 5 days after the end of isolation for the person with COVID-19. °Wear a well-fitting mask when you are around the person with COVID-19, and do this throughout their isolation period. °Wear a well-fitting mask around others for 10 days after the infected person's isolation period ends. °Isolate immediately if you develop symptoms of COVID-19 or test positive. °What should I do if multiple people I live with test positive for COVID-19 at different times? °Recommendations for this situation depend on your vaccination status: °If you are not up to date with your COVID-19 vaccines, you should: °Quarantine throughout the isolation period of any infected person that you live with. °Continue to quarantine until 5 days after the end of isolation date for the most recently infected person that lives with you. For example, if   the last day of isolation of the person most recently infected with COVID-19 was June 30, the new 5-day quarantine period starts on July 1. °Get tested at least 5 days after the end  of isolation for the most recently infected person that lives with you. °Wear a well-fitting mask when you are around any person with COVID-19 while that person is in isolation. °Wear a well-fitting mask when you are around other people until 10 days after your last close contact. °Isolate immediately if you develop symptoms of COVID-19 or test positive. °If you are up to date with your COVID-19 vaccines, you should: °Get tested at least 5 days after your first exposure. A person with COVID-19 is considered infectious starting 2 days before they developed symptoms, or 2 days before the date of their positive test if they do not have symptoms. °Get tested again at least 5 days after the end of isolation for the most recently infected person that lives with you. °Wear a well-fitting mask when you are around any person with COVID-19 while that person is in isolation. °Wear a well-fitting mask around others for 10 days after the end of isolation for the most recently infected person that lives with you. For example, if the last day of isolation for the person most recently infected with COVID-19 was June 30, the new 10-day period to wear a well-fitting mask indoors in public starts on July 1. °Isolate immediately if you develop symptoms of COVID-19 or test positive. °I had COVID-19 and completed isolation. Do I have to quarantine or get tested if someone I live with gets COVID-19 shortly after I completed isolation? °No. If you recently completed isolation and someone that lives with you tests positive for the virus that causes COVID-19 shortly after the end of your isolation period, you do not have to quarantine or get tested as long as you do not develop new symptoms. Once all of the people that live together have completed isolation or quarantine, refer to the guidance below for new exposures to COVID-19. °If you had COVID-19 in the previous 90 days and then came into close contact with someone with COVID-19, you do  not have to quarantine or get tested if you do not have symptoms. But you should: °Wear a well-fitting mask indoors in public for 10 days after your last close contact. °Monitor for COVID-19 symptoms for 10 days from the date of your last close contact. °Isolate immediately and get tested if symptoms develop. °If more than 90 days have passed since your recovery from infection, follow CDC's recommendations for close contacts. These recommendations will differ depending on your vaccination status. °01/21/2021 °Content source: National Center for Immunization and Respiratory Diseases (NCIRD), Division of Viral Diseases °This information is not intended to replace advice given to you by your health care provider. Make sure you discuss any questions you have with your health care provider. °Document Revised: 05/27/2021 Document Reviewed: 05/27/2021 °Elsevier Patient Education © 2022 [employer] ° °

## 2022-06-05 ENCOUNTER — Emergency Department
Admission: EM | Admit: 2022-06-05 | Discharge: 2022-06-05 | Disposition: A | Discharge status: Home / Self Care | Payer: BC Managed Care – PPO | Attending: Nurse Practitioner | Admitting: Nurse Practitioner

## 2022-06-05 DIAGNOSIS — L237 Allergic contact dermatitis due to plants, except food: Secondary | ICD-10-CM | POA: Insufficient documentation

## 2022-06-05 DIAGNOSIS — L03113 Cellulitis of right upper limb: Secondary | ICD-10-CM | POA: Insufficient documentation

## 2022-06-05 DIAGNOSIS — L0390 Cellulitis, unspecified: Secondary | ICD-10-CM

## 2022-06-05 MED ORDER — CEPHALEXIN 500 MG PO CAPS
500.0000 mg | ORAL_CAPSULE | Freq: Three times a day (TID) | ORAL | 0 refills | Status: AC
Start: 2022-06-05 — End: 2022-06-12

## 2022-06-05 MED ORDER — PREDNISONE 20 MG PO TABS
40.0000 mg | ORAL_TABLET | Freq: Every day | ORAL | 0 refills | Status: AC
Start: 2022-06-05 — End: ?

## 2022-06-05 NOTE — Discharge Instructions (Addendum)
Discharge Instructions Include:     Start prednisone burst x 5 days.   Start Keflex antibiotic as directed.   Return with any worsening concerns.

## 2022-06-05 NOTE — ED Triage Notes (Signed)
Patient complains of itchy rash that began on her hands that has spread to her face and neck after working in the garden last Monday.  Patient has been using calamine lotion and benadryl.  "I have poison of some sort"

## 2022-06-05 NOTE — ED Notes (Signed)
Discharge education provided, patient expressed understanding, all questions/concerns addressed.  Patient discharged with belongings via ambulation.

## 2022-06-05 NOTE — ED Provider Notes (Signed)
[facility]  History and Physical Exam       Patient Name: Deborah Potter, Deborah Potter  Encounter Date:  06/05/2022  Nurse Practitioner: Rodney Langton, FNP  Attending Physician: Robert Bellow, MD  PCP: Nathanial Rancher, MD  Patient DOB:  July 18, 1956  MRN:  [id number]  Room:  E10/EDA10-A      History of Presenting Illness     Chief complaint: Rash    HPI/ROS given by: Patient,     Denece Spezia is a 66 y.o. female who presents with an itching rash to the right hand and left face and neck, onset about 5 days ago to a week ago after doing yard work. Reports itching.  She has been using benadryl and calamine lotion. Denies any oral swelling.        Review of Systems     Review of Systems   Constitutional:  Negative for chills, fatigue and fever.   HENT:  Negative for congestion.    Respiratory:  Negative for cough.    Skin:  Positive for rash.        Allergies & Medications     Pt has No Known Allergies.    Discharge Medication List as of 06/05/2022 11:29 AM        CONTINUE these medications which have NOT CHANGED    Details   buPROPion SR (WELLBUTRIN SR) 100 MG 12 hr tablet Take 1 tablet (100 mg) by mouth daily, Starting Mon 04/05/2022, Historical Med      Cholecalciferol (vitamin D3) 125 MCG (5000 UT) tablet Take 2 tablets (10,000 Units) by mouth daily, Historical Med      DULoxetine (CYMBALTA) 60 MG capsule Take 1 capsule (60 mg) by mouth daily, Starting Mon 04/05/2022, Historical Med      lamoTRIgine (LaMICtal) 200 MG tablet Take 1 tablet (200 mg) by mouth daily, Starting Mon 04/05/2022, Historical Med      lisinopril (ZESTRIL) 10 MG tablet Take 1 tablet (10 mg) by mouth daily, Starting Mon 03/30/2021, Historical Med      valACYclovir (VALTREX) 1000 MG tablet Take 1 tablet (1,000 mg) by mouth daily, Starting Thu 05/21/2021, Historical Med              Past Medical History     Pt has no past medical history on file.     Past Surgical History     Pt  has no past surgical history on file.     Family History     The  family history is not on file.     Social History     Pt has no history on file for tobacco use, alcohol use, and drug use.     Physical Exam     Vitals:    06/05/22 1103 06/05/22 1134   BP: 148/74 148/74   Pulse: 84 84   Resp: 20 20   Temp: 98.1 F (36.7 C) 98.1 F (36.7 C)   TempSrc:  Oral   SpO2: 97% 97%   Weight: 76 kg    Height: 1.702 m          Physical Exam  Vitals and nursing note reviewed.   Constitutional:       Appearance: Normal appearance. She is normal weight.   HENT:      Head: Normocephalic.      Nose: Nose normal.      Mouth/Throat:      Mouth: Mucous membranes are moist.   Skin:     General:  Skin is warm.      Capillary Refill: Capillary refill takes less than 2 seconds.      Findings: Erythema and rash (excoriated urticarial patches right chin and neck, and right hand.  mild surrounding erythema.  does not follow a dermatomal pattern and crosses the midline, involves left and right side.) present.   Neurological:      Mental Status: She is alert and oriented to person, place, and time. Mental status is at baseline.   Psychiatric:         Thought Content: Thought content normal.            Diagnostic Results     The results of the diagnostic studies below have been reviewed by myself:    Labs  Results       ** No results found for the last 24 hours. **            Radiologic Studies  No results found.           ED Course and Medical Decision Making     ED Medication Orders (From admission, onward)      None                     Medical Decision Making  This patient presents to the ED with contact dermatitis, plant in origin, to the left face and right hand with early cellulitis.  Differential diagnosis includes cellulitis, contact dermatitis, poison ivy poison oak, shingles, amongst others.  The rashes on bilateral parts of the body, low concern for any shingles presentation at this time.  I will start the patient on a prednisone burst x5 days and instructed to start Keflex x7 days.  She was  instructed to continue with her Benadryl as needed for itching and also to continue with calamine lotion as she has to the rash.  Diagnostic impression and plan of care discussed with patient with good understanding and agreement.    Problems Addressed:  Allergic contact dermatitis due to plants, except food: acute illness or injury  Cellulitis, unspecified cellulitis site: acute illness or injury    Risk  Prescription drug management.      Discharge Instructions Include:     Start prednisone burst x 5 days.   Start Keflex antibiotic as directed.   Return with any worsening concerns.           In addition to the above history, please see nursing notes. Allergies, meds, past medical, family, social hx, and the results of the diagnostic studies performed have been reviewed by myself.    I discussed this case with the attending physician in the emergency department and they agree with the assessment and treatment plan.     This chart was generated by an EMR and may contain errors or omissions not intended by the user.     Procedures / Critical Care     None     Diagnosis / Disposition     Clinical Impression  1. Allergic contact dermatitis due to plants, except food    2. Cellulitis, unspecified cellulitis site        Disposition  ED Disposition       ED Disposition   Discharge    Condition   --    Date/Time   Sat Jun 05, 2022 11:29 AM    Comment   Margorie John discharge to home/self care.    Condition at disposition: Stable  Follow up for Discharged Patients  [facility] [place]  [phone]    As needed, If symptoms worsen      Prescriptions for Discharged Patients  Discharge Medication List as of 06/05/2022 11:29 AM        START taking these medications    Details   cephALEXin (KEFLEX) 500 MG capsule Take 1 capsule (500 mg) by mouth 3 (three) times daily for 7 days, Starting Sat 06/05/2022, Until Sat 06/12/2022, E-Rx      predniSONE (DELTASONE) 20 MG  tablet Take 2 tablets (40 mg) by mouth daily, Starting Sat 06/05/2022, E-Rx                Signed by:  Rodney Langton, FNP, DNP       Colin Benton, FNP  06/05/22 1654

## 2022-08-04 ENCOUNTER — Other Ambulatory Visit: Payer: Self-pay | Admitting: Obstetrics and Gynecology

## 2022-08-04 DIAGNOSIS — Z1231 Encounter for screening mammogram for malignant neoplasm of breast: Secondary | ICD-10-CM

## 2022-08-10 ENCOUNTER — Ambulatory Visit
Admission: RE | Admit: 2022-08-10 | Discharge: 2022-08-10 | Disposition: A | Discharge status: Home / Self Care | Payer: BC Managed Care – PPO | Source: Ambulatory Visit | Attending: Obstetrics and Gynecology | Admitting: Obstetrics and Gynecology

## 2022-08-10 DIAGNOSIS — Z1231 Encounter for screening mammogram for malignant neoplasm of breast: Secondary | ICD-10-CM

## 2022-09-07 ENCOUNTER — Other Ambulatory Visit: Payer: Self-pay | Admitting: Nurse Practitioner

## 2022-09-08 ENCOUNTER — Inpatient Hospital Stay: Payer: BC Managed Care – PPO | Attending: Obstetrics and Gynecology | Admitting: Obstetrics and Gynecology

## 2022-09-08 ENCOUNTER — Encounter: Payer: Self-pay | Admitting: Obstetrics and Gynecology

## 2022-09-08 VITALS — BP 144/69 | HR 79 | Temp 96.6°F | Resp 20 | Wt 165.1 lb

## 2022-09-08 DIAGNOSIS — Z79899 Other long term (current) drug therapy: Secondary | ICD-10-CM | POA: Diagnosis not present

## 2022-09-08 DIAGNOSIS — D071 Carcinoma in situ of vulva: Secondary | ICD-10-CM | POA: Diagnosis not present

## 2022-09-08 DIAGNOSIS — I10 Essential (primary) hypertension: Secondary | ICD-10-CM | POA: Insufficient documentation

## 2022-09-08 DIAGNOSIS — N903 Dysplasia of vulva, unspecified: Secondary | ICD-10-CM

## 2022-09-08 NOTE — Patient Instructions (Signed)
Follow up with Dr. Beasley in 6 months ?

## 2022-09-08 NOTE — Progress Notes (Signed)
Gynecologic Oncology Interval Visit   Referring Provider: Dr. Dalbert Garnet  Chief Complaint: VIN 3 s/p WLE  Subjective:  Denise Rasmussen is a 66 y.o. 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   She presents for surveillance and has no complaints. She continues to follow up Dr. Dalbert Garnet for pessary management for grade 2 anterior and apical prolapse.  No new complaints.   Gynecologic History:  Denise Rasmussen is a pleasant patient who was seen consultation from Dr. 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 Her history is noted below.  07/01/2020 vulvar nodules-3 small white verrucal lesions of the posterior fourchette -biopsies performed x 3.  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  Uses pessary for symptoms.  She is postmenopausal at age 40.  Denies history of abnormal Pap smears. G2P2    We discussed options for management including surgery versus topical therapy with imiquimod. Given risk of occult malignancy recommend surgery with colposcopy of the vulvar, directed biopsies, and WLE. She opted for WLE.   Underwent WLE with Dr. Sonia Side on 08/27/20.   Pathology:  DIAGNOSIS:  A. VULVA, RIGHT AT 7:00; BIOPSY:  - BENIGN SQUAMOUS MUCOSA.  - NEGATIVE FOR DYSPLASIA AND MALIGNANCY.   B. VULVA, LEFT LABIA MINORA AT 3:00; BIOPSY:  - BENIGN SQUAMOUS MUCOSA.  - NEGATIVE FOR DYSPLASIA AND MALIGNANCY.   C. VULVA, RIGHT LABIA MINORA AT 9:00; BIOPSY:  - BENIGN SQUAMOUS MUCOSA.  - NEGATIVE FOR DYSPLASIA AND MALIGNANCY.   D. VULVA, POSTERIOR MIDLINE; EXCISION:  - HIGH-GRADE SQUAMOUS INTRAEPITHELIAL LESION (VIN 3, SEVERE DYSPLASIA).  - NEGATIVE FOR INVASIVE CARCINOMA.  - ALL SURGICAL MARGINS FREE OF DYSPLASIA  We discussed that no adjuvant treatment would be recommended.  Clinically NED.    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   Pure hypercholesterolemia 10/05/2017   Vaccine counseling 10/05/2017   Essential hypertension 02/04/2017   Moderate episode of recurrent major depressive disorder (HCC) 02/04/2017   Primary osteoarthritis of both hands 02/04/2017   Ganglion cyst 07/01/2016   Hand pain 06/30/2016   HSV infection 05/03/2015   Depression, major, single episode, mild (HCC) 05/03/2015   Arthritis, degenerative 05/03/2015   Post menopausal syndrome 05/03/2015   Borderline diabetes mellitus 05/03/2015   Raynaud's syndrome 05/03/2015   Vitamin D deficiency 05/03/2015   Cervical pain 07/26/2008   Insomnia 01/24/2008   Past Medical History:  Diagnosis Date   Allergy    Anxiety    Arthritis    Breast mass 10/2016   right benign   Depression    Hypertension    Pre-diabetes    diet controlled   Past Surgical History:  Procedure Laterality Date   COLONOSCOPY     COLPOSCOPY N/A 08/27/2020   Procedure: COLPOSCOPY WITH BIOPSIES;  Surgeon: Artelia Laroche, MD;  Location: [facility] ORS;  Service: Gynecology;  Laterality: N/A;   vaginal delivery     x2   VULVECTOMY N/A 08/27/2020   Procedure: WIDE EXCISION VULVECTOMY;  Surgeon: Artelia Laroche, MD;  Location: [facility] ORS;  Service: Gynecology;  Laterality: N/A;   Past Gynecologic History:  Menarche: 14 History of Abnormal pap: No Last pap: as per HPI Sexually active: yes  OB History   No obstetric history on file.    Family  History  Problem Relation Age of Onset   Breast cancer Maternal Grandmother    Hypertension Mother    Diabetes Mother    Heart disease Mother    Social History   Tobacco Use   Smoking status: Never   Smokeless tobacco: Never  Vaping Use   Vaping Use: Never used  Substance Use Topics   Alcohol use: Yes    Alcohol/week: 0.0 standard drinks    Comment: occasionally   Drug use: No   Immunization History  Administered  Date(s) Administered   Influenza Split 07/16/2009   Influenza,inj,Quad PF,6+ Mos 08/15/2019   Influenza,inj,quad, With Preservative 07/07/2016   Influenza-Unspecified 07/25/2014, 07/06/2016, 07/05/2017, 08/02/2018, 07/16/2020   PFIZER Comirnaty(Gray Top)Covid-19 Tri-Sucrose Vaccine 12/08/2019, 12/29/2019, 08/11/2020   PFIZER(Purple Top)SARS-COV-2 Vaccination 12/08/2019, 12/29/2019   Tdap 11/20/2010    Allergies:  Patient has no known allergies.  Current Outpatient Medications on File Prior to Visit  Medication Sig Dispense Refill   acetaminophen (TYLENOL) 500 MG tablet Take 1,000 mg by mouth every 6 (six) hours as needed for moderate pain or headache.     buPROPion (WELLBUTRIN SR) 100 MG 12 hr tablet Take 50 mg by mouth daily.      Cholecalciferol (VITAMIN D-3) 125 MCG (5000 UT) TABS Take 10,000 Units by mouth daily.     DULoxetine (CYMBALTA) 60 MG capsule Take 60 mg by mouth every morning.     estradiol (ESTRACE) 0.1 MG/GM vaginal cream Place 1 Applicatorful vaginally daily as needed (dryness). 42.5 g 2   lamoTRIgine (LAMICTAL) 200 MG tablet Take 200 mg by mouth daily.   1   lisinopril (PRINIVIL,ZESTRIL) 10 MG tablet TAKE 1 TABLET(10 MG) BY MOUTH DAILY (Patient taking differently: Take 10 mg by mouth daily.) 30 tablet 0   metroNIDAZOLE (METROGEL) 0.75 % vaginal gel Place 1 Applicatorful vaginally daily as needed (irritation).     Omega-3 Fatty Acids (FISH OIL) 1000 MG CAPS Take by mouth.     valACYclovir (VALTREX) 1000 MG tablet Take 1,000 mg by mouth daily.     ALPRAZolam (XANAX) 0.5 MG tablet TAKE 1 TABLET BY MOUTH AS NEEDED FOR ANXIETY (Patient not taking: Reported on 02/04/2021) 30 tablet 0   traZODone (DESYREL) 50 MG tablet Take 25-50 mg by mouth at bedtime as needed for sleep.  (Patient not taking: Reported on 02/04/2021)     No current facility-administered medications on file prior to visit.    Review of Systems   General: no complaints  HEENT: seasonal allergies  Lungs: no  complaints  Cardiac: no complaints  GI: no complaints  GU: no complaints  Musculoskeletal: no complaints  Extremities: no complaints  Skin: no complaints  Neuro: no complaints  Endocrine: no complaints  Psych: no complaints     Objective:  Physical Examination:  Today's Vitals   02/03/22 1441 02/03/22 1449  BP: 122/67   Pulse: 76   Resp: 16   Temp: 98.2 F (36.8 C)   TempSrc: Tympanic   SpO2: 99%   Weight: 165 lb (74.8 kg)   Height: 5\' 7"  (1.702 m)   PainSc: 0-No pain 0-No pain   Body mass index is 25.84 kg/m.   GENERAL: Patient is a well appearing female in no acute distress HEENT:  Atraumatic and normocephalic. PERRL, neck supple. NODES:  No cervical, supraclavicular, axillary, or inguinal lymphadenopathy palpated.  LUNGS:  Clear to auscultation bilaterally.  No wheezes. HEART:  Regular rate and rhythm.  ABDOMEN:  Soft, nontender. Nondistended.  EXTREMITIES:  No peripheral edema.  SKIN:  Clear with no obvious rashes or skin changes.  NEURO:  Nonfocal. Well oriented.  Appropriate affect.  Pelvic: Exam chaperoned by CMA EGBUS: no lesions Cervix: pessary in place.  Vagina: no lesions, no discharge or bleeding Uterus: not enlarged.  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 Pathology positive for VIN3 with negative margins. Wound separation, healed by secondary intention. No evidence of recurrence.  Vulvar atrophy  Medical co-morbidities complicating care: HTN and borderline diabetes.   Plan:   VIN III (vulvar intraepithelial neoplasia III)  Continue close follow up with Dr Dalbert Garnet in 6 months.  Spoke to her about signs and symptoms of VIN/cancer that should prompt visit to see [place] sooner.   Leida Lauth, MD  CC: Dr. Dalbert Garnet

## 2023-03-05 ENCOUNTER — Other Ambulatory Visit: Payer: Self-pay | Admitting: Nurse Practitioner

## 2023-08-11 ENCOUNTER — Other Ambulatory Visit: Payer: Self-pay | Admitting: Family Medicine

## 2023-08-11 DIAGNOSIS — Z1231 Encounter for screening mammogram for malignant neoplasm of breast: Secondary | ICD-10-CM

## 2024-05-28 ENCOUNTER — Encounter: Payer: Self-pay | Admitting: Oncology

## 2024-05-28 ENCOUNTER — Inpatient Hospital Stay

## 2024-05-28 ENCOUNTER — Inpatient Hospital Stay: Attending: Oncology | Admitting: Oncology

## 2024-05-28 VITALS — BP 133/65 | HR 68 | Temp 97.0°F | Resp 18 | Wt 139.0 lb

## 2024-05-28 DIAGNOSIS — D72819 Decreased white blood cell count, unspecified: Secondary | ICD-10-CM | POA: Insufficient documentation

## 2024-05-28 DIAGNOSIS — Z833 Family history of diabetes mellitus: Secondary | ICD-10-CM | POA: Diagnosis not present

## 2024-05-28 DIAGNOSIS — Z8249 Family history of ischemic heart disease and other diseases of the circulatory system: Secondary | ICD-10-CM | POA: Insufficient documentation

## 2024-05-28 DIAGNOSIS — Z803 Family history of malignant neoplasm of breast: Secondary | ICD-10-CM | POA: Insufficient documentation

## 2024-05-28 DIAGNOSIS — Z79899 Other long term (current) drug therapy: Secondary | ICD-10-CM | POA: Insufficient documentation

## 2024-05-28 DIAGNOSIS — Z79624 Long term (current) use of inhibitors of nucleotide synthesis: Secondary | ICD-10-CM | POA: Diagnosis not present

## 2024-05-28 LAB — CBC WITH DIFFERENTIAL/PLATELET
Abs Immature Granulocytes: 0.01 10*3/uL (ref 0.00–0.07)
Basophils Absolute: 0 10*3/uL (ref 0.0–0.1)
Basophils Relative: 1 %
Eosinophils Absolute: 0.1 10*3/uL (ref 0.0–0.5)
Eosinophils Relative: 2 %
HCT: 34.9 % — ABNORMAL LOW (ref 36.0–46.0)
Hemoglobin: 11.4 g/dL — ABNORMAL LOW (ref 12.0–15.0)
Immature Granulocytes: 0 %
Lymphocytes Relative: 26 %
Lymphs Abs: 1 10*3/uL (ref 0.7–4.0)
MCH: 28.8 pg (ref 26.0–34.0)
MCHC: 32.7 g/dL (ref 30.0–36.0)
MCV: 88.1 fL (ref 80.0–100.0)
Monocytes Absolute: 0.4 10*3/uL (ref 0.1–1.0)
Monocytes Relative: 12 %
Neutro Abs: 2.2 10*3/uL (ref 1.7–7.7)
Neutrophils Relative %: 59 %
Platelets: 234 10*3/uL (ref 150–400)
RBC: 3.96 MIL/uL (ref 3.87–5.11)
RDW: 16.1 % — ABNORMAL HIGH (ref 11.5–15.5)
WBC: 3.7 10*3/uL — ABNORMAL LOW (ref 4.0–10.5)
nRBC: 0 % (ref 0.0–0.2)

## 2024-05-28 LAB — COMPREHENSIVE METABOLIC PANEL WITH GFR
ALT: 16 U/L (ref 0–44)
AST: 22 U/L (ref 15–41)
Albumin: 4.2 g/dL (ref 3.5–5.0)
Alkaline Phosphatase: 66 U/L (ref 38–126)
Anion gap: 7 (ref 5–15)
BUN: 20 mg/dL (ref 8–23)
CO2: 28 mmol/L (ref 22–32)
Calcium: 9.6 mg/dL (ref 8.9–10.3)
Chloride: 100 mmol/L (ref 98–111)
Creatinine, Ser: 0.91 mg/dL (ref 0.44–1.00)
GFR, Estimated: >60 mL/min (ref >60)
Glucose, Bld: 108 mg/dL — ABNORMAL HIGH (ref 70–99)
Potassium: 4.9 mmol/L (ref 3.5–5.1)
Sodium: 135 mmol/L (ref 135–145)
Total Bilirubin: 0.6 mg/dL (ref 0.0–1.2)
Total Protein: 6.8 g/dL (ref 6.5–8.1)

## 2024-05-28 LAB — LACTATE DEHYDROGENASE: LDH: 145 U/L (ref 98–192)

## 2024-05-28 NOTE — Progress Notes (Signed)
 [facility]  Telephone:(336) [phone] Fax:(336) [phone]  ID: Denise Rasmussen Denise Rasmussen OB: [birth year]  MR#: [id number]  RDW#:[id number]  Denise Rasmussen Care Team: Verdon Keen, MD as PCP - General (Obstetrics and Gynecology) Jacobo Evalene PARAS, MD as Consulting Physician (Oncology)  CHIEF COMPLAINT: Leukopenia, unspecified.  INTERVAL HISTORY: Denise Rasmussen is 68 year old female who was noted to have a decreased total white blood cell count on routine blood work.  Repeat testing confirmed the results.  Denise Rasmussen is anxious, but otherwise feels well.  Denise Rasmussen has no neurologic complaints.  Denise Rasmussen denies any recent fevers or illnesses.  Denise Rasmussen has good appetite and denies weight loss.  Denise Rasmussen has no new medications.  Denise Rasmussen has no chest pain, shortness of breath, cough, or hemoptysis.  Denise Rasmussen denies any nausea, vomiting, constipation, or diarrhea.  Denise Rasmussen has no urinary complaints.  Denise Rasmussen offers no specific complaints today.  REVIEW OF SYSTEMS:   Review of Systems  Constitutional: Negative.  Negative for fever, malaise/fatigue and weight loss.  Respiratory: Negative.  Negative for cough, hemoptysis and shortness of breath.   Cardiovascular: Negative.  Negative for chest pain and leg swelling.  Gastrointestinal: Negative.  Negative for abdominal pain.  Genitourinary: Negative.  Negative for dysuria.  Musculoskeletal: Negative.  Negative for back pain.  Skin: Negative.  Negative for rash.  Neurological: Negative.  Negative for dizziness, focal weakness, weakness and headaches.  Psychiatric/Behavioral:  The Denise Rasmussen is nervous/anxious.     As per HPI. Otherwise, a complete review of systems is negative.  PAST MEDICAL HISTORY: Past Medical History:  Diagnosis Date   Allergy    Anxiety    Arthritis    Breast mass 10/2016   right benign   Depression    Hypertension    Pre-diabetes    diet controlled    PAST SURGICAL HISTORY: Past Surgical History:  Procedure Laterality Date   COLONOSCOPY     COLPOSCOPY N/A  08/27/2020   Procedure: COLPOSCOPY WITH BIOPSIES;  Surgeon: Elby Webb Loges, MD;  Location: [facility];  Service: Gynecology;  Laterality: N/A;   vaginal delivery     x2   VULVECTOMY N/A 08/27/2020   Procedure: WIDE EXCISION VULVECTOMY;  Surgeon: Elby Webb Loges, MD;  Location: [facility];  Service: Gynecology;  Laterality: N/A;    FAMILY HISTORY: Family History  Problem Relation Age of Onset   Breast cancer Maternal Grandmother    Hypertension Mother    Diabetes Mother    Heart disease Mother     ADVANCED DIRECTIVES (Y/N):  N  HEALTH MAINTENANCE: Social History   Tobacco Use   Smoking status: Never   Smokeless tobacco: Never  Vaping Use   Vaping status: Never Used  Substance Use Topics   Alcohol use: Yes    Alcohol/week: 0.0 standard drinks of alcohol    Comment: occasionally   Drug use: No     Colonoscopy:  PAP:  Bone density:  Lipid panel:  No Known Allergies  Current Outpatient Medications  Medication Sig Dispense Refill   acetaminophen (TYLENOL) 500 MG tablet Take 1,000 mg by mouth every 6 (six) hours as needed for moderate pain or headache.     ALPRAZolam  (XANAX ) 0.5 MG tablet TAKE 1 TABLET BY MOUTH AS NEEDED FOR ANXIETY 30 tablet 0   buPROPion (WELLBUTRIN SR) 100 MG 12 hr tablet Take 50 mg by mouth daily.      Cholecalciferol (VITAMIN D -3) 125 MCG (5000 UT) TABS Take 10,000 Units by mouth daily.     DULoxetine (CYMBALTA) 60 MG capsule Take  60 mg by mouth every morning.     estradiol  (ESTRACE ) 0.1 MG/GM vaginal cream INSERT 1 GRAM VAGINALLY AT NIGHT 3 TIMES A WEEK 42.5 g 2   lamoTRIgine (LAMICTAL) 200 MG tablet Take 200 mg by mouth daily.   1   lisinopril  (PRINIVIL ,ZESTRIL ) 10 MG tablet TAKE 1 TABLET(10 MG) BY MOUTH DAILY 30 tablet 0   metroNIDAZOLE (METROGEL) 0.75 % vaginal gel Place 1 Applicatorful vaginally daily as needed (irritation).     Omega-3 Fatty Acids (FISH OIL) 1000 MG CAPS Take by mouth.     Semaglutide,0.25 or 0.5MG /DOS, 2 MG/3ML  SOPN Inject 0.75 mg into the skin.     traZODone  (DESYREL ) 50 MG tablet Take 25-50 mg by mouth at bedtime as needed for sleep.      valACYclovir (VALTREX) 1000 MG tablet Take 1,000 mg by mouth daily.     No current facility-administered medications for this visit.    OBJECTIVE: Vitals:   05/28/24 1122  BP: 133/65  Pulse: 68  Resp: 18  Temp: (!) 97 F (36.1 C)  SpO2: 100%     Body mass index is 21.77 kg/m.    ECOG FS:0 - Asymptomatic  General: Well-developed, well-nourished, no acute distress. Eyes: Pink conjunctiva, anicteric sclera. HEENT: Normocephalic, moist mucous membranes. Lungs: No audible wheezing or coughing. Heart: Regular rate and rhythm. Abdomen: Soft, nontender, no obvious distention. Musculoskeletal: No edema, cyanosis, or clubbing. Neuro: Alert, answering all questions appropriately. Cranial nerves grossly intact. Skin: No rashes or petechiae noted. Psych: Normal affect. Lymphatics: No cervical, calvicular, axillary or inguinal LAD.   LAB RESULTS:  Lab Results  Component Value Date   NA 135 05/28/2024   K 4.9 05/28/2024   CL 100 05/28/2024   CO2 28 05/28/2024   GLUCOSE 108 (H) 05/28/2024   BUN 20 05/28/2024   CREATININE 0.91 05/28/2024   CALCIUM 9.6 05/28/2024   PROT 6.8 05/28/2024   ALBUMIN 4.2 05/28/2024   AST 22 05/28/2024   ALT 16 05/28/2024   ALKPHOS 66 05/28/2024   BILITOT 0.6 05/28/2024   GFRNONAA >60 05/28/2024   GFRAA 83 09/27/2016    Lab Results  Component Value Date   WBC 3.7 (L) 05/28/2024   NEUTROABS 2.2 05/28/2024   HGB 11.4 (L) 05/28/2024   HCT 34.9 (L) 05/28/2024   MCV 88.1 05/28/2024   PLT 234 05/28/2024     STUDIES: No results found.  ASSESSMENT: Leukopenia, unspecified.  PLAN:    Leukopenia, unspecified: Chronic and unchanged.  Denise Rasmussen's total white blood cell count is 3.7 today.  Upon review of Denise Rasmussen's chart, Denise Rasmussen was noted to have a mild leukopenia back in December 2017 and Denise Rasmussen total white blood cell count  has ranged between 3.5 and 4.4 since that time.  All of Denise Rasmussen other laboratory work is pending at time of dictation.  No intervention is needed.  Denise Rasmussen does not require bone marrow biopsy.  Denise Rasmussen will have video-assisted telemedicine visit in 3 weeks to discuss the results.  I spent a total of 45 minutes reviewing chart data, face-to-face evaluation with the Denise Rasmussen, counseling and coordination of care as detailed above.   Denise Rasmussen expressed understanding and was in agreement with this plan. Denise Rasmussen also understands that Denise Rasmussen can call clinic at any time with any questions, concerns, or complaints.     Evalene JINNY Reusing, MD   05/28/2024 12:25 PM

## 2024-05-31 LAB — COMP PANEL: LEUKEMIA/LYMPHOMA

## 2024-06-05 LAB — NEUTROPHIL AB TEST LEVEL 1: NEUTROPHIL SCR/PANEL RESULT: NEGATIVE

## 2024-06-18 ENCOUNTER — Encounter: Payer: Self-pay | Admitting: Oncology

## 2024-06-19 ENCOUNTER — Inpatient Hospital Stay: Admitting: Oncology

## 2024-06-19 ENCOUNTER — Inpatient Hospital Stay (HOSPITAL_BASED_OUTPATIENT_CLINIC_OR_DEPARTMENT_OTHER): Admitting: Oncology

## 2024-06-19 DIAGNOSIS — D72819 Decreased white blood cell count, unspecified: Secondary | ICD-10-CM

## 2024-06-19 NOTE — Progress Notes (Signed)
 [facility]  Telephone:(336) [phone] Fax:(336) [phone]  ID: Denise Rasmussen OB: [birth year]  MR#: [id number]  RDW#:[id number]  Patient Care Team: Verdon Keen, MD as PCP - General (Obstetrics and Gynecology) Jacobo Evalene PARAS, MD as Consulting Physician (Oncology)  I connected with Denise Rasmussen on 06/19/24 at  9:00 AM EDT by video enabled telemedicine visit and verified that I am speaking with the correct person using two identifiers.   I discussed the limitations, risks, security and privacy concerns of performing an evaluation and management service by telemedicine and the availability of in-person appointments. I also discussed with the patient that there may be a patient responsible charge related to this service. The patient expressed understanding and agreed to proceed.   Other persons participating in the visit and their role in the encounter: Patient, MD.  Patient's location: Home. Provider's location: Clinic.  CHIEF COMPLAINT: Leukopenia, unspecified.  INTERVAL HISTORY: Patient agreed to video-assisted telemedicine visit for further evaluation and discussion of her laboratory results.  She currently feels well and is asymptomatic.  She has no neurologic complaints.  She denies any recent fevers or illnesses.  She has a good appetite and denies weight loss. She has no chest pain, shortness of breath, cough, or hemoptysis.  She denies any nausea, vomiting, constipation, or diarrhea.  She has no urinary complaints.  Patient offers no specific complaints today.  REVIEW OF SYSTEMS:   Review of Systems  Constitutional: Negative.  Negative for fever, malaise/fatigue and weight loss.  Respiratory: Negative.  Negative for cough, hemoptysis and shortness of breath.   Cardiovascular: Negative.  Negative for chest pain and leg swelling.  Gastrointestinal: Negative.  Negative for abdominal pain.  Genitourinary: Negative.  Negative for dysuria.  Musculoskeletal:  Negative.  Negative for back pain.  Skin: Negative.  Negative for rash.  Neurological: Negative.  Negative for dizziness, focal weakness, weakness and headaches.  Psychiatric/Behavioral: Negative.  The patient is not nervous/anxious.     As per HPI. Otherwise, a complete review of systems is negative.  PAST MEDICAL HISTORY: Past Medical History:  Diagnosis Date   Allergy    Anxiety    Arthritis    Breast mass 10/2016   right benign   Depression    Hypertension    Pre-diabetes    diet controlled    PAST SURGICAL HISTORY: Past Surgical History:  Procedure Laterality Date   COLONOSCOPY     COLPOSCOPY N/A 08/27/2020   Procedure: COLPOSCOPY WITH BIOPSIES;  Surgeon: Elby Webb Loges, MD;  Location: [facility];  Service: Gynecology;  Laterality: N/A;   vaginal delivery     x2   VULVECTOMY N/A 08/27/2020   Procedure: WIDE EXCISION VULVECTOMY;  Surgeon: Elby Webb Loges, MD;  Location: [facility];  Service: Gynecology;  Laterality: N/A;    FAMILY HISTORY: Family History  Problem Relation Age of Onset   Breast cancer Maternal Grandmother    Hypertension Mother    Diabetes Mother    Heart disease Mother     ADVANCED DIRECTIVES (Y/N):  N  HEALTH MAINTENANCE: Social History   Tobacco Use   Smoking status: Never   Smokeless tobacco: Never  Vaping Use   Vaping status: Never Used  Substance Use Topics   Alcohol use: Yes    Alcohol/week: 0.0 standard drinks of alcohol    Comment: occasionally   Drug use: No     Colonoscopy:  PAP:  Bone density:  Lipid panel:  No Known Allergies  Current Outpatient Medications  Medication  Sig Dispense Refill   acetaminophen (TYLENOL) 500 MG tablet Take 1,000 mg by mouth every 6 (six) hours as needed for moderate pain or headache.     ALPRAZolam  (XANAX ) 0.5 MG tablet TAKE 1 TABLET BY MOUTH AS NEEDED FOR ANXIETY 30 tablet 0   buPROPion (WELLBUTRIN SR) 100 MG 12 hr tablet Take 50 mg by mouth daily.      Cholecalciferol  (VITAMIN D -3) 125 MCG (5000 UT) TABS Take 10,000 Units by mouth daily.     DULoxetine (CYMBALTA) 60 MG capsule Take 60 mg by mouth every morning.     estradiol  (ESTRACE ) 0.1 MG/GM vaginal cream INSERT 1 GRAM VAGINALLY AT NIGHT 3 TIMES A WEEK 42.5 g 2   lamoTRIgine (LAMICTAL) 200 MG tablet Take 200 mg by mouth daily.   1   lisinopril  (PRINIVIL ,ZESTRIL ) 10 MG tablet TAKE 1 TABLET(10 MG) BY MOUTH DAILY 30 tablet 0   metroNIDAZOLE (METROGEL) 0.75 % vaginal gel Place 1 Applicatorful vaginally daily as needed (irritation).     Omega-3 Fatty Acids (FISH OIL) 1000 MG CAPS Take by mouth.     Semaglutide,0.25 or 0.5MG /DOS, 2 MG/3ML SOPN Inject 0.75 mg into the skin.     traZODone  (DESYREL ) 50 MG tablet Take 25-50 mg by mouth at bedtime as needed for sleep.      valACYclovir (VALTREX) 1000 MG tablet Take 1,000 mg by mouth daily.     No current facility-administered medications for this visit.    OBJECTIVE: There were no vitals filed for this visit.    There is no height or weight on file to calculate BMI.    ECOG FS:0 - Asymptomatic  General: Well-developed, well-nourished, no acute distress. HEENT: Normocephalic. Neuro: Alert, answering all questions appropriately. Cranial nerves grossly intact. Psych: Normal affect.  LAB RESULTS:  Lab Results  Component Value Date   NA 135 05/28/2024   K 4.9 05/28/2024   CL 100 05/28/2024   CO2 28 05/28/2024   GLUCOSE 108 (H) 05/28/2024   BUN 20 05/28/2024   CREATININE 0.91 05/28/2024   CALCIUM 9.6 05/28/2024   PROT 6.8 05/28/2024   ALBUMIN 4.2 05/28/2024   AST 22 05/28/2024   ALT 16 05/28/2024   ALKPHOS 66 05/28/2024   BILITOT 0.6 05/28/2024   GFRNONAA >60 05/28/2024   GFRAA 83 09/27/2016    Lab Results  Component Value Date   WBC 3.7 (L) 05/28/2024   NEUTROABS 2.2 05/28/2024   HGB 11.4 (L) 05/28/2024   HCT 34.9 (L) 05/28/2024   MCV 88.1 05/28/2024   PLT 234 05/28/2024     STUDIES: No results found.  ASSESSMENT: Leukopenia,  unspecified.  PLAN:    Leukopenia, unspecified: Chronic and unchanged.  Patient's most recent total white blood cell count was reported at 3.7.  Upon review of patient's chart, she was noted to have a mild leukopenia back in December 2017 and her total white blood cell count has ranged between 3.5 and 4.4 since that time.  All of her other laboratory work including peripheral blood flow cytometry, neutrophil antibodies, and LDH are all either negative or within normal limits. No intervention is needed.  Patient does not require bone marrow biopsy.  No further follow-up is necessary.  Please refer patient back if she has any questions or concerns. Anemia: Mild.  Patient reports that she recently initiated oral iron supplementation.  I provided 20 minutes of face-to-face video visit time during this encounter which included chart review, counseling, and coordination of care as documented above.  Patient expressed understanding and was in agreement with this plan. She also understands that She can call clinic at any time with any questions, concerns, or complaints.     Evalene JINNY Reusing, MD   06/19/2024 4:55 PM

## 2024-08-15 ENCOUNTER — Other Ambulatory Visit: Payer: Self-pay | Admitting: Family Medicine

## 2024-08-15 DIAGNOSIS — Z1231 Encounter for screening mammogram for malignant neoplasm of breast: Secondary | ICD-10-CM

## 2024-08-22 ENCOUNTER — Ambulatory Visit: Admission: RE | Admit: 2024-08-22 | Discharge: 2024-08-22 | Disposition: A | Source: Ambulatory Visit

## 2024-08-22 DIAGNOSIS — Z1231 Encounter for screening mammogram for malignant neoplasm of breast: Secondary | ICD-10-CM
